# Patient Record
Sex: Female | Born: 1985
Health system: Southern US, Community
[De-identification: ages and names within clinical notes are randomized; demographics above are authoritative.]

## PROBLEM LIST (undated history)

## (undated) DIAGNOSIS — D649 Anemia, unspecified: Secondary | ICD-10-CM

## (undated) HISTORY — DX: Anemia, unspecified: D64.9

---
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
## 2015-08-07 DIAGNOSIS — G8220 Paraplegia, unspecified: Secondary | ICD-10-CM | POA: Insufficient documentation

## 2015-10-01 ENCOUNTER — Encounter (HOSPITAL_BASED_OUTPATIENT_CLINIC_OR_DEPARTMENT_OTHER): Payer: Self-pay | Admitting: Emergency Medicine

## 2015-10-01 ENCOUNTER — Emergency Department (HOSPITAL_BASED_OUTPATIENT_CLINIC_OR_DEPARTMENT_OTHER)
Admission: EM | Admit: 2015-10-01 | Discharge: 2015-10-01 | Disposition: A | Discharge status: Home / Self Care | Payer: Medicaid Other | Attending: Emergency Medicine | Admitting: Emergency Medicine

## 2015-10-01 DIAGNOSIS — Z79899 Other long term (current) drug therapy: Secondary | ICD-10-CM | POA: Diagnosis not present

## 2015-10-01 DIAGNOSIS — F17210 Nicotine dependence, cigarettes, uncomplicated: Secondary | ICD-10-CM | POA: Diagnosis not present

## 2015-10-01 DIAGNOSIS — J0100 Acute maxillary sinusitis, unspecified: Secondary | ICD-10-CM | POA: Diagnosis not present

## 2015-10-01 DIAGNOSIS — H9202 Otalgia, left ear: Secondary | ICD-10-CM | POA: Diagnosis present

## 2015-10-01 MED ORDER — OXYMETAZOLINE HCL 0.05 % NA SOLN: 1.0000 | Freq: Two times a day (BID) | NASAL | 0 refills | Status: DC

## 2015-10-01 MED ORDER — BENZONATATE 100 MG PO CAPS: 200.0000 mg | ORAL_CAPSULE | Freq: Two times a day (BID) | ORAL | 0 refills | Status: DC | PRN

## 2015-10-01 MED FILL — NASAL DECONGESTANT 0.05% SP: 0.05 | 30 days supply | Qty: 15 | Fill #0

## 2015-10-01 MED FILL — BENZONATATE 100 MG CAPSULE: 100 | 5 days supply | Qty: 20 | Fill #0

## 2015-10-01 NOTE — ED Triage Notes (Signed)
Pt c/o LT ear pain, radiating to face and throat x 3d; also reports productive cough with "brown stuff" x 2d.

## 2015-10-01 NOTE — Discharge Instructions (Signed)
Take your medications as prescribed. You may also take Tylenol or ibuprofen as prescribed over-the-counter as needed for pain relief. Continue drinking fluids at home to remain hydrated. Follow-up with your primary care provider in the next 3-4 days if your symptoms have not improved. Please return to the Emergency Department if symptoms worsen or new onset of fever, headache, neck stiffness, difficulty breathing, chest pain, abdominal pain, loss of hearing, ear drainage, facial swelling.

## 2015-10-01 NOTE — ED Provider Notes (Signed)
[facility] Provider Note   CSN: [id number] Arrival date & time: 10/01/15  [id number]     History   Chief Complaint Chief Complaint  Patient presents with  . Otalgia    HPI Dana Chen is a 30 y.o. female.  Patient is a 30 year old female with no pertinent past medical history presents to the ED with complaint of left ear pain, onset 3 days. Patient reports she has had worsening left ear pain which she notes radiates to her throat. Endorses associated sinus pressure, nasal congestion, postnasal drip, productive cough (yellow sputum), nausea. She states she has been taking over-the-counter NyQuil and Cold-Eeze without relief. Denies fever, headache, neck stiffness, visual/neck swelling, dysphasia, difficulty breathing, wheezing, chest pain, abdominal pain. She notes one of her children came home from school with cold-like symptoms earlier this week.    History reviewed. No pertinent past medical history.  There are no active problems to display for this patient.   History reviewed. No pertinent surgical history.  OB History    No data available       Home Medications    Prior to Admission medications   Medication Sig Start Date End Date Taking? Authorizing Provider  Vitamin D, Ergocalciferol, (DRISDOL) 50000 units CAPS capsule Take 50,000 Units by mouth every 7 (seven) days.   Yes Historical Provider, MD  benzonatate (TESSALON) 100 MG capsule Take 2 capsules (200 mg total) by mouth 2 (two) times daily as needed for cough. 10/01/15   Barrett HenleNicole Elizabeth Larin Depaoli, PA-C  oxymetazoline (AFRIN NASAL SPRAY) 0.05 % nasal spray Place 1 spray into both nostrils 2 (two) times daily. Do not use for more than 3 days prevent rebound rhinorrhea. 10/01/15   Barrett HenleNicole Elizabeth Naythan Douthit, PA-C    Family History History reviewed. No pertinent family history.  Social History Social History  Substance Use Topics  . Smoking status: Current Every Day Smoker    Packs/day: 1.00    Types:  Cigarettes  . Smokeless tobacco: Never Used  . Alcohol use Yes     Comment: social, occasional      Allergies   Review of patient's allergies indicates no known allergies.   Review of Systems Review of Systems  HENT: Positive for congestion, ear pain, postnasal drip, sinus pressure and sore throat.   Respiratory: Positive for cough.   Gastrointestinal: Positive for nausea.  All other systems reviewed and are negative.    Physical Exam Updated Vital Signs BP 118/90 (BP Location: Left Arm)   Pulse 84   Temp 98.1 F (36.7 C) (Oral)   Resp 18   Ht 5\' 5"  (1.651 m)   Wt 71.2 kg   LMP  (LMP Unknown)   SpO2 98%   BMI 26.13 kg/m   Physical Exam  Constitutional: She is oriented to person, place, and time. She appears well-developed and well-nourished. No distress.  HENT:  Head: Normocephalic and atraumatic.  Right Ear: A middle ear effusion is present.  Left Ear: A middle ear effusion is present.  Nose: Right sinus exhibits maxillary sinus tenderness and frontal sinus tenderness. Left sinus exhibits maxillary sinus tenderness and frontal sinus tenderness.  Mouth/Throat: Uvula is midline, oropharynx is clear and moist and mucous membranes are normal. No oropharyngeal exudate, posterior oropharyngeal edema, posterior oropharyngeal erythema or tonsillar abscesses. No tonsillar exudate.  Eyes: Conjunctivae and EOM are normal. Pupils are equal, round, and reactive to light. Right eye exhibits no discharge. Left eye exhibits no discharge. No scleral icterus.  Neck: Normal range of motion.  Neck supple.  Cardiovascular: Normal rate, regular rhythm, normal heart sounds and intact distal pulses.   Pulmonary/Chest: Effort normal and breath sounds normal. No respiratory distress. She has no wheezes. She has no rales. She exhibits no tenderness.  Abdominal: Soft. Bowel sounds are normal. She exhibits no distension and no mass. There is no tenderness. There is no rebound and no guarding.    Musculoskeletal: Normal range of motion. She exhibits no edema.  Lymphadenopathy:    She has no cervical adenopathy.  Neurological: She is alert and oriented to person, place, and time.  Skin: Skin is warm and dry. She is not diaphoretic.  Nursing note and vitals reviewed.    ED Treatments / Results  Labs (all labs ordered are listed, but only abnormal results are displayed) Labs Reviewed - No data to display  EKG  EKG Interpretation None       Radiology No results found.  Procedures Procedures (including critical care time)  Medications Ordered in ED Medications - No data to display   Initial Impression / Assessment and Plan / ED Course  I have reviewed the triage vital signs and the nursing notes.  Pertinent labs & imaging results that were available during my care of the patient were reviewed by me and considered in my medical decision making (see chart for details).  Clinical Course    Patient complaining of symptoms of sinusitis/URI.    Mild symptoms of clear/yellow nasal discharge/congestion and scratchy throat with cough for less than 10 days.  Patient is afebrile.  No concern for acute bacterial rhinosinusitis; likely viral in nature.  Patient discharged with symptomatic treatment.  Patient instructions given for warm saline nasal washes.  Recommendations for follow-up with primary care physician.     Final Clinical Impressions(s) / ED Diagnoses   Final diagnoses:  Acute maxillary sinusitis, recurrence not specified    New Prescriptions New Prescriptions   BENZONATATE (TESSALON) 100 MG CAPSULE    Take 2 capsules (200 mg total) by mouth 2 (two) times daily as needed for cough.   OXYMETAZOLINE (AFRIN NASAL SPRAY) 0.05 % NASAL SPRAY    Place 1 spray into both nostrils 2 (two) times daily. Do not use for more than 3 days prevent rebound rhinorrhea.     Satira Sark [place], [place] 10/01/15 [address] [place], [place] 10/01/15 [phone]

## 2017-10-14 ENCOUNTER — Ambulatory Visit
Admission: RE | Admit: 2017-10-14 | Discharge: 2017-10-14 | Disposition: A | Discharge status: Home / Self Care | Payer: Disability Insurance | Source: Ambulatory Visit | Attending: Pediatrics | Admitting: Pediatrics

## 2017-10-14 ENCOUNTER — Other Ambulatory Visit: Payer: Self-pay | Admitting: Pediatrics

## 2017-10-14 DIAGNOSIS — X58XXXA Exposure to other specified factors, initial encounter: Secondary | ICD-10-CM | POA: Diagnosis not present

## 2017-10-14 DIAGNOSIS — S3992XA Unspecified injury of lower back, initial encounter: Secondary | ICD-10-CM | POA: Diagnosis not present

## 2017-10-14 DIAGNOSIS — T1490XA Injury, unspecified, initial encounter: Secondary | ICD-10-CM

## 2018-01-04 NOTE — L&D Delivery Note (Signed)
Vaginal Delivery Note  Spontaneous delivery of live viable female infant from the LOA position through an intact  perineum. Delivery of anterior right shoulder with gentle downward guidance followed by delivery of the left posterior shoulder with gentle upward guidance. Body followed spontaneously. Body cord was reduced.  Infant placed on maternal chest. Nursery present and helped with neonatal resuscitation and evaluation. Cord clamped and cut after one minute. Cord blood collected. Placenta delivered spontaneously and intact with a 3 vessel cord.  2nd degree laceration. Uterus firm and below umbilicus at the end of the delivery.  Mom and baby recovering in stable condition. Sponge and needle counts were correct at the end of the delivery.  APGARS: 1 minute:8 5 minutes: 9 Weight: pending Name: Lanny Cramp MD [place], [facility] 07/11/18 10:53 PM

## 2018-01-26 ENCOUNTER — Encounter: Payer: Self-pay | Admitting: Obstetrics and Gynecology

## 2018-02-03 ENCOUNTER — Ambulatory Visit (INDEPENDENT_AMBULATORY_CARE_PROVIDER_SITE_OTHER): Payer: Medicaid Other | Admitting: Maternal Newborn

## 2018-02-03 ENCOUNTER — Other Ambulatory Visit (HOSPITAL_COMMUNITY)
Admission: RE | Admit: 2018-02-03 | Discharge: 2018-02-03 | Disposition: A | Discharge status: Home / Self Care | Payer: Medicaid Other | Source: Ambulatory Visit | Attending: Obstetrics and Gynecology | Admitting: Obstetrics and Gynecology

## 2018-02-03 ENCOUNTER — Encounter: Payer: Self-pay | Admitting: Maternal Newborn

## 2018-02-03 VITALS — BP 120/70 | Wt 187.0 lb

## 2018-02-03 DIAGNOSIS — Z3A17 17 weeks gestation of pregnancy: Secondary | ICD-10-CM

## 2018-02-03 DIAGNOSIS — Z113 Encounter for screening for infections with a predominantly sexual mode of transmission: Secondary | ICD-10-CM | POA: Insufficient documentation

## 2018-02-03 DIAGNOSIS — F1290 Cannabis use, unspecified, uncomplicated: Secondary | ICD-10-CM | POA: Insufficient documentation

## 2018-02-03 DIAGNOSIS — O99322 Drug use complicating pregnancy, second trimester: Secondary | ICD-10-CM

## 2018-02-03 DIAGNOSIS — Z3480 Encounter for supervision of other normal pregnancy, unspecified trimester: Secondary | ICD-10-CM | POA: Insufficient documentation

## 2018-02-03 DIAGNOSIS — Z3201 Encounter for pregnancy test, result positive: Secondary | ICD-10-CM

## 2018-02-03 DIAGNOSIS — O99332 Smoking (tobacco) complicating pregnancy, second trimester: Secondary | ICD-10-CM

## 2018-02-03 DIAGNOSIS — F17210 Nicotine dependence, cigarettes, uncomplicated: Secondary | ICD-10-CM

## 2018-02-03 DIAGNOSIS — O99330 Smoking (tobacco) complicating pregnancy, unspecified trimester: Secondary | ICD-10-CM

## 2018-02-03 DIAGNOSIS — Z369 Encounter for antenatal screening, unspecified: Secondary | ICD-10-CM

## 2018-02-03 LAB — POCT URINALYSIS DIPSTICK OB
Glucose, UA: NEGATIVE
PROTEIN: NEGATIVE

## 2018-02-03 LAB — POCT URINE PREGNANCY: Preg Test, Ur: POSITIVE — AB

## 2018-02-03 LAB — OB RESULTS CONSOLE VARICELLA ZOSTER ANTIBODY, IGG: Varicella: IMMUNE

## 2018-02-03 MED ORDER — NICOTINE 21 MG/24HR TD PT24: 21.0000 mg | MEDICATED_PATCH | Freq: Every day | TRANSDERMAL | 1 refills | Status: DC

## 2018-02-03 NOTE — Progress Notes (Signed)
02/03/2018   Chief Complaint: Amenorrhea, positive home pregnancy test, desires prenatal care.  Transfer of Care Patient: no  History of Present Illness: Dana Chen is a 33 y.o. G3P2002 at [redacted]w[redacted]d based on Patient's last menstrual period on 10/01/2017, with an Estimated Date of Delivery: 07/08/2018, with the above CC.   Her periods were: regular periods every month. She has Positive signs or symptoms of nausea/vomiting of pregnancy. She has Negative signs or symptoms of miscarriage or preterm labor She identifies Negative Zika risk factors for her and her partner On any different medications around the time she conceived/early pregnancy: No, currently occasional use of Zofran for nausea History of varicella: Yes    Review of Systems  Constitutional: Positive for malaise/fatigue.       Change in appetite  HENT: Negative.   Eyes: Negative.   Respiratory: Positive for cough. Negative for shortness of breath and wheezing.   Cardiovascular: Negative for chest pain and palpitations.  Gastrointestinal: Positive for constipation, nausea and vomiting. Negative for abdominal pain.  Genitourinary: Positive for frequency. Negative for dysuria.  Musculoskeletal: Positive for joint pain and myalgias.  Skin: Positive for itching.  Neurological: Positive for tingling and headaches.  Endo/Heme/Allergies: Positive for environmental allergies.       Hot flashes and heat/cold intolerance  Psychiatric/Behavioral: Negative for depression. The patient is nervous/anxious.   Breasts: positive for tenderness  ROS: Review of systems was otherwise negative, except as stated in the above HPI.  OBGYN History: As per HPI. OB History  Gravida Para Term Preterm AB Living  3 2 2  0 0 2  SAB TAB Ectopic Multiple Live Births          2    # Outcome Date GA Lbr Len/2nd Weight Sex Delivery Anes PTL Lv  3 Current           2 Term           1 Term             Any issues with any prior pregnancies: no Any prior  children are healthy, doing well, without any problems or issues: yes History of pap smears: Yes. Last pap smear 08/04/2017. Abnormal: Patient reports normal Pap, requested that she obtain records. History of STIs: Yes, remote history of chlamydia.   Past Medical History: History reviewed. No pertinent past medical history.  Past Surgical History: History reviewed. No pertinent surgical history.  Family History:  Family History  Problem Relation Age of Onset  . Brain cancer Mother    She denies any female cancers, bleeding or blood clotting disorders.  She reports an uncle with Down's syndrome and a sister with cerebral palsy. No other history of intellectual disability, birth defects or genetic disorders in her or the FOB's history  Social History:  Social History   Socioeconomic History  . Marital status: Single    Spouse name: Not on file  . Number of children: Not on file  . Years of education: Not on file  . Highest education level: Not on file  Occupational History  . Not on file  Social Needs  . Financial resource strain: Not on file  . Food insecurity:    Worry: Not on file    Inability: Not on file  . Transportation needs:    Medical: Not on file    Non-medical: Not on file  Tobacco Use  . Smoking status: Current Every Day Smoker    Packs/day: 1.00    Types: Cigarettes  .  Smokeless tobacco: Never Used  Substance and Sexual Activity  . Alcohol use: Yes    Comment: social, occasional   . Drug use: Yes    Frequency: 2.0 times per week    Types: Marijuana  . Sexual activity: Yes    Birth control/protection: None  Lifestyle  . Physical activity:    Days per week: Not on file    Minutes per session: Not on file  . Stress: Not on file  Relationships  . Social connections:    Talks on phone: Not on file    Gets together: Not on file    Attends religious service: Not on file    Active member of club or organization: Not on file    Attends meetings of clubs  or organizations: Not on file    Relationship status: Not on file  . Intimate partner violence:    Fear of current or ex partner: Not on file    Emotionally abused: Not on file    Physically abused: Not on file    Forced sexual activity: Not on file  Other Topics Concern  . Not on file  Social History Narrative  . Not on file   Any cats in the household: yes, does not handle cat feces or litter Domestic violence screening negative.  Allergy: Allergies  Allergen Reactions  . Peanut Oil Hives  . Strawberry Extract Hives    Current Outpatient Medications:  Current Outpatient Medications:  .  benzonatate (TESSALON) 100 MG capsule, Take 2 capsules (200 mg total) by mouth 2 (two) times daily as needed for cough. (Patient not taking: Reported on 02/03/2018), Disp: 20 capsule, Rfl: 0 .  nicotine (NICODERM CQ - DOSED IN MG/24 HOURS) 21 mg/24hr patch, Place 1 patch (21 mg total) onto the skin daily., Disp: 28 patch, Rfl: 1 .  oxymetazoline (AFRIN NASAL SPRAY) 0.05 % nasal spray, Place 1 spray into both nostrils 2 (two) times daily. Do not use for more than 3 days prevent rebound rhinorrhea. (Patient not taking: Reported on 02/03/2018), Disp: 30 mL, Rfl: 0 .  Vitamin D, Ergocalciferol, (DRISDOL) 50000 units CAPS capsule, Take 50,000 Units by mouth every 7 (seven) days., Disp: , Rfl:    Physical Exam:   BP 120/70   Wt 187 lb (84.8 kg)   LMP 10/01/2017    Constitutional: Well nourished, well developed female in no acute distress.  Neck:  Supple, normal appearance, and no thyromegaly  Cardiovascular: S1, S2 normal, no murmur, rub or gallop, regular rate and rhythm Respiratory:  Clear to auscultation bilaterally. Normal respiratory effort Abdomen: positive bowel sounds and no masses, hernias; diffusely non tender to palpation, non distended Breasts: breasts appear normal, no suspicious masses, no skin or nipple changes or axillary nodes. Neuro/Psych:  Normal mood and affect.  Skin:  Warm  and dry.  Lymphatic:  No inguinal lymphadenopathy.   External genitalia, Bartholin's glands, Urethra, Skene's glands: within normal limits Vagina: within normal limits and with no blood in the vault  Cervix: deferred per patient request Uterus:  enlarged, c/w 17 week size Adnexa:  no mass, fullness, tenderness  Assessment: Dana Chen is a 33 y.o. G3P2002 at [redacted]w[redacted]d based on Patient's last menstrual period was 10/01/2017, with an Estimated Date of Delivery: 07/08/2018, presenting for prenatal care.  Plan:  1) Avoid alcoholic beverages. 2) Patient encouraged not to smoke. Currently smoking 4-5 cigarettes/day. Willing to try patch to help her stop. 3) Discontinue the use of all non-medicinal drugs and chemicals. Admits marijuana  use to help with nausea. Advised cessation and complete avoidance during pregnancy. 4) Take prenatal vitamins daily.  5) Seatbelt use advised 6) Nutrition, food safety (fish, cheese advisories, and high nitrite foods) and exercise discussed. 7) Hospital and practice style delivering [facility] discussed  8) Patient is asked about travel to areas at risk for the Zika virus, and counseled to avoid travel and exposure to mosquitoes or people who may have themselves been exposed to the virus.   9) [facility] advised 10) Genetic Screening, such as with 1st Trimester Screening, cell free fetal DNA, AFP testing, and Ultrasound, is discussed with patient. She plans to have genetic testing this pregnancy. 11) FHT heard today in 150s.  Problem list reviewed and updated.  Return in about 1 week (around 02/10/2018) for ROB and ultrasound.  Marcelyn BruinsJacelyn Valkyrie Guardiola, CNM [facility], [facility] 02/03/2018

## 2018-02-05 LAB — URINE CULTURE

## 2018-02-06 ENCOUNTER — Encounter: Payer: Self-pay | Admitting: Maternal Newborn

## 2018-02-07 LAB — RPR+RH+ABO+RUB AB+AB SCR+CB...
ANTIBODY SCREEN: NEGATIVE
HEMATOCRIT: 30 % — AB (ref 34.0–46.6)
HIV SCREEN 4TH GENERATION: NONREACTIVE
Hemoglobin: 10.2 g/dL — ABNORMAL LOW (ref 11.1–15.9)
Hepatitis B Surface Ag: NEGATIVE
MCH: 30.3 pg (ref 26.6–33.0)
MCHC: 34 g/dL (ref 31.5–35.7)
MCV: 89 fL (ref 79–97)
PLATELETS: 294 10*3/uL (ref 150–450)
RBC: 3.37 x10E6/uL — AB (ref 3.77–5.28)
RDW: 13.1 % (ref 11.7–15.4)
RPR: NONREACTIVE
Rh Factor: POSITIVE
Rubella Antibodies, IGG: 2.5 index (ref >0.99)
Varicella zoster IgG: 954 index (ref >165)
WBC: 7.3 10*3/uL (ref 3.4–10.8)

## 2018-02-07 LAB — HEMOGLOBINOPATHY EVALUATION
HEMOGLOBIN F QUANTITATION: 0 % (ref 0.0–2.0)
HGB C: 39.1 % — AB
HGB S: 0 %
HGB VARIANT: 0 %
Hemoglobin A2 Quantitation: 3.1 % (ref 1.8–3.2)
Hgb A: 57.8 % — ABNORMAL LOW (ref 96.4–98.8)

## 2018-02-07 LAB — URINE DRUG PANEL 7
AMPHETAMINES, URINE: NEGATIVE ng/mL
BARBITURATE QUANT UR: NEGATIVE ng/mL
BENZODIAZEPINE QUANT UR: NEGATIVE ng/mL
Cannabinoid Quant, Ur: POSITIVE — AB
Cocaine (Metab.): NEGATIVE ng/mL
OPIATE QUANT UR: NEGATIVE ng/mL
PCP Quant, Ur: NEGATIVE ng/mL

## 2018-02-07 LAB — CERVICOVAGINAL ANCILLARY ONLY
Chlamydia: NEGATIVE
NEISSERIA GONORRHEA: NEGATIVE

## 2018-02-10 ENCOUNTER — Other Ambulatory Visit: Payer: Self-pay | Admitting: Maternal Newborn

## 2018-02-10 DIAGNOSIS — Z3480 Encounter for supervision of other normal pregnancy, unspecified trimester: Secondary | ICD-10-CM

## 2018-02-10 DIAGNOSIS — O99019 Anemia complicating pregnancy, unspecified trimester: Secondary | ICD-10-CM

## 2018-02-10 MED ORDER — FUSION PLUS PO CAPS: 1.0000 | ORAL_CAPSULE | Freq: Every day | ORAL | 6 refills | Status: DC

## 2018-02-10 NOTE — Progress Notes (Signed)
Rx sent for Fusion Plus iron supplement.

## 2018-02-13 ENCOUNTER — Ambulatory Visit (INDEPENDENT_AMBULATORY_CARE_PROVIDER_SITE_OTHER): Payer: Medicaid Other

## 2018-02-13 ENCOUNTER — Encounter: Payer: Self-pay | Admitting: Certified Nurse Midwife

## 2018-02-13 ENCOUNTER — Ambulatory Visit (INDEPENDENT_AMBULATORY_CARE_PROVIDER_SITE_OTHER): Payer: Medicaid Other | Admitting: Certified Nurse Midwife

## 2018-02-13 VITALS — BP 110/68 | Wt 192.0 lb

## 2018-02-13 DIAGNOSIS — Z3482 Encounter for supervision of other normal pregnancy, second trimester: Secondary | ICD-10-CM

## 2018-02-13 DIAGNOSIS — Z369 Encounter for antenatal screening, unspecified: Secondary | ICD-10-CM

## 2018-02-13 DIAGNOSIS — Z3A19 19 weeks gestation of pregnancy: Secondary | ICD-10-CM

## 2018-02-13 DIAGNOSIS — Z363 Encounter for antenatal screening for malformations: Secondary | ICD-10-CM | POA: Diagnosis not present

## 2018-02-13 DIAGNOSIS — Z3480 Encounter for supervision of other normal pregnancy, unspecified trimester: Secondary | ICD-10-CM

## 2018-02-13 LAB — POCT URINALYSIS DIPSTICK OB
Glucose, UA: NEGATIVE
POC,PROTEIN,UA: NEGATIVE

## 2018-02-13 NOTE — Progress Notes (Signed)
ROB Anatomy Scan/ It's a Girl!

## 2018-02-13 NOTE — Progress Notes (Signed)
ROB/ anatomy scan at 19wk2d: CGA 19wk2d, cephalic, incomplete anatomy for cervical length and fetal spine. Placenta anterior and 2.4cm from cx. Female gender NOB labs reviewed: Hemoglobin C trait. States that FOB does not have sickle cell trait.. Advised to ask FOB whether he had ever had test to check for  Hemoglobinopathy Mild anemia with H&H 10.2 gm/dl and 84%.  +FM PLan: Follow up anatomy scan and ROB in 4 weeks  Farrel Conners, CNM  Addendum: called patient regarding desires for MaterniT 21. She will schedule appointment to have labs drawn in the next week. Farrel Conners, CNM

## 2018-02-17 ENCOUNTER — Telehealth: Payer: Self-pay

## 2018-02-17 NOTE — Telephone Encounter (Signed)
CLG wants to know if patient wants genetic testing done since it says she desires it. Called pt, no answer, LVMTRC.

## 2018-02-17 NOTE — Telephone Encounter (Signed)
Tried again before leaving and no luck. No voice msg left this time.

## 2018-02-21 ENCOUNTER — Telehealth: Payer: Self-pay

## 2018-02-21 NOTE — Telephone Encounter (Signed)
Pt states she does want the Genetic testing, Does she need to come in for labs? Or wait for next apt. She also wanted to let CLG know she is having a lot of pressure if she stands or sits too long, should she been seen for this, please advise

## 2018-02-21 NOTE — Telephone Encounter (Signed)
Pt called triage, left message for CLG to return call. I called pt to get more info, no answer, left message for her to call [facility] back.

## 2018-02-21 NOTE — Telephone Encounter (Signed)
Dana Chen is going to schedule pt as soon as possible to see provider and have labs done per CLG

## 2018-02-21 NOTE — Telephone Encounter (Signed)
Please add her on to my schedule this week. Thanks

## 2018-02-21 NOTE — Telephone Encounter (Signed)
Please advise. Thank you

## 2018-02-21 NOTE — Telephone Encounter (Signed)
Patient is schedule 02/22/18 with CLG °

## 2018-02-22 ENCOUNTER — Ambulatory Visit (INDEPENDENT_AMBULATORY_CARE_PROVIDER_SITE_OTHER): Payer: Medicaid Other | Admitting: Certified Nurse Midwife

## 2018-02-22 VITALS — BP 122/70 | Wt 189.0 lb

## 2018-02-22 DIAGNOSIS — Z3A20 20 weeks gestation of pregnancy: Secondary | ICD-10-CM

## 2018-02-22 DIAGNOSIS — O26892 Other specified pregnancy related conditions, second trimester: Secondary | ICD-10-CM

## 2018-02-22 DIAGNOSIS — N949 Unspecified condition associated with female genital organs and menstrual cycle: Secondary | ICD-10-CM

## 2018-02-22 DIAGNOSIS — R102 Pelvic and perineal pain: Secondary | ICD-10-CM

## 2018-02-22 DIAGNOSIS — Z3480 Encounter for supervision of other normal pregnancy, unspecified trimester: Secondary | ICD-10-CM

## 2018-02-22 DIAGNOSIS — Z1379 Encounter for other screening for genetic and chromosomal anomalies: Secondary | ICD-10-CM

## 2018-02-22 NOTE — Progress Notes (Signed)
OB problem visit  C/o pelvic pressure, cant sit or stand for to long

## 2018-02-26 NOTE — Progress Notes (Signed)
33 year old G3 P2002 at [facility] presented for MaterniT 21 testing with complaints of pelvic pressure. 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 Starts feeling a lot of pelvic pressure and soreness after 6 hours on her feet. No dysuria. NO vaginal bleeding Cervix: closed/ thick/ OOP Some mild descensus of the uterus with Valsalva  MaterniT 21 drawn. Letter for work recommending sitting after 4-6 hours on feet Recommend maternity support garment/ belt FU as scheduled. Will call with lab results.  Farrel Conners, CNM

## 2018-02-27 LAB — MATERNIT21 PLUS CORE+SCA
CHROMOSOME 21: NEGATIVE
Chromosome 13: NEGATIVE
Chromosome 18: NEGATIVE
Fetal Fraction: 13
Y Chromosome: NOT DETECTED

## 2018-03-10 ENCOUNTER — Other Ambulatory Visit: Payer: Self-pay | Admitting: Obstetrics & Gynecology

## 2018-03-10 DIAGNOSIS — Z3482 Encounter for supervision of other normal pregnancy, second trimester: Secondary | ICD-10-CM

## 2018-03-13 ENCOUNTER — Other Ambulatory Visit: Payer: Medicaid Other

## 2018-03-13 ENCOUNTER — Encounter: Payer: Medicaid Other | Admitting: Obstetrics & Gynecology

## 2018-03-15 ENCOUNTER — Other Ambulatory Visit: Payer: Self-pay

## 2018-03-15 ENCOUNTER — Ambulatory Visit (INDEPENDENT_AMBULATORY_CARE_PROVIDER_SITE_OTHER): Payer: Medicaid Other | Admitting: Advanced Practice Midwife

## 2018-03-15 ENCOUNTER — Ambulatory Visit (INDEPENDENT_AMBULATORY_CARE_PROVIDER_SITE_OTHER): Payer: Medicaid Other

## 2018-03-15 ENCOUNTER — Encounter: Payer: Self-pay | Admitting: Advanced Practice Midwife

## 2018-03-15 VITALS — BP 100/60 | Wt 194.0 lb

## 2018-03-15 DIAGNOSIS — Z113 Encounter for screening for infections with a predominantly sexual mode of transmission: Secondary | ICD-10-CM

## 2018-03-15 DIAGNOSIS — Z3482 Encounter for supervision of other normal pregnancy, second trimester: Secondary | ICD-10-CM

## 2018-03-15 DIAGNOSIS — Z362 Encounter for other antenatal screening follow-up: Secondary | ICD-10-CM

## 2018-03-15 DIAGNOSIS — Z3A23 23 weeks gestation of pregnancy: Secondary | ICD-10-CM

## 2018-03-15 DIAGNOSIS — Z131 Encounter for screening for diabetes mellitus: Secondary | ICD-10-CM

## 2018-03-15 DIAGNOSIS — Z130 Encounter for screening for diseases of the blood and blood-forming organs and certain disorders involving the immune mechanism: Secondary | ICD-10-CM

## 2018-03-15 DIAGNOSIS — Z3480 Encounter for supervision of other normal pregnancy, unspecified trimester: Secondary | ICD-10-CM

## 2018-03-15 NOTE — Progress Notes (Signed)
  Routine Prenatal Care Visit  Subjective  Dana Chen is a 33 y.o. G3P2002 at [redacted]w[redacted]d being seen today for ongoing prenatal care.  She is currently monitored for the following issues for this low-risk pregnancy and has Supervision of other normal pregnancy, antepartum; Tobacco smoking affecting pregnancy, antepartum; and Marijuana use on their problem list.  ----------------------------------------------------------------------------------- Patient reports no complaints.  Reviewed safe OTC meds for seasonal allergies.  Contractions: Not present. Vag. Bleeding: None.  Movement: Present. Denies leaking of fluid.  ----------------------------------------------------------------------------------- The following portions of the patient's history were reviewed and updated as appropriate: allergies, current medications, past family history, past medical history, past social history, past surgical history and problem list. Problem list updated.   Objective  Blood pressure 100/60, weight 194 lb (88 kg), last menstrual period 10/01/2017. Pregravid weight 163 lb (73.9 kg) Total Weight Gain 31 lb (14.1 kg) Urinalysis: Urine Protein    Urine Glucose    Fetal Status: Fetal Heart Rate (bpm): 144 Fundal Height: 24 cm Movement: Present     Follow up anatomy today: complete for spine, cervical length 4.0 cm, no previa, anterior placenta, breech, AFI normal  General:  Alert, oriented and cooperative. Patient is in no acute distress.  Skin: Skin is warm and dry. No rash noted.   Cardiovascular: Normal heart rate noted  Respiratory: Normal respiratory effort, no problems with respiration noted  Abdomen: Soft, gravid, appropriate for gestational age. Pain/Pressure: Present     Pelvic:  Cervical exam deferred        Extremities: Normal range of motion.  Edema: None  Mental Status: Normal mood and affect. Normal behavior. Normal judgment and thought content.   Assessment   33 y.o. [id number] at [redacted]w[redacted]d by   07/08/2018, by Last Menstrual Period presenting for routine prenatal visit  Plan   pregnancy3 Problems (from 10/01/17 to present)    Problem Noted Resolved   Supervision of other normal pregnancy, antepartum 02/03/2018 by Oswaldo Conroy, CNM No   Overview Signed 02/03/2018  1:38 PM by Oswaldo Conroy, CNM    Clinic [facility] Prenatal Labs  Dating  Blood type:     Genetic Screen 1 Screen:    AFP:     Quad:     NIPS: Antibody:   Anatomic [place]  Rubella:   Varicella:    GTT Early:               Third trimester:  RPR:     Rhogam  HBsAg:     TDaP vaccine  Flu Shot: HIV:     Baby Food                                GBS:   Contraception  Pap:  CBB     CS/VBAC    Support Person                  Preterm labor symptoms and general obstetric precautions including but not limited to vaginal bleeding, contractions, leaking of fluid and fetal movement were reviewed in detail with the patient.    Return in about 4 weeks (around 04/12/2018) for 28 wk labs and rob.  Tresea Mall, CNM 03/15/2018 4:00 PM

## 2018-04-12 ENCOUNTER — Ambulatory Visit (INDEPENDENT_AMBULATORY_CARE_PROVIDER_SITE_OTHER): Payer: Medicaid Other | Admitting: Obstetrics and Gynecology

## 2018-04-12 ENCOUNTER — Other Ambulatory Visit: Payer: Medicaid Other

## 2018-04-12 ENCOUNTER — Other Ambulatory Visit: Payer: Self-pay

## 2018-04-12 VITALS — BP 126/58 | Wt 194.0 lb

## 2018-04-12 DIAGNOSIS — Z3A27 27 weeks gestation of pregnancy: Secondary | ICD-10-CM

## 2018-04-12 DIAGNOSIS — D582 Other hemoglobinopathies: Secondary | ICD-10-CM | POA: Insufficient documentation

## 2018-04-12 DIAGNOSIS — Z23 Encounter for immunization: Secondary | ICD-10-CM | POA: Diagnosis not present

## 2018-04-12 DIAGNOSIS — Z131 Encounter for screening for diabetes mellitus: Secondary | ICD-10-CM

## 2018-04-12 DIAGNOSIS — Z3480 Encounter for supervision of other normal pregnancy, unspecified trimester: Secondary | ICD-10-CM

## 2018-04-12 DIAGNOSIS — Z130 Encounter for screening for diseases of the blood and blood-forming organs and certain disorders involving the immune mechanism: Secondary | ICD-10-CM

## 2018-04-12 DIAGNOSIS — Z113 Encounter for screening for infections with a predominantly sexual mode of transmission: Secondary | ICD-10-CM

## 2018-04-12 DIAGNOSIS — Z3482 Encounter for supervision of other normal pregnancy, second trimester: Secondary | ICD-10-CM

## 2018-04-12 LAB — POCT URINALYSIS DIPSTICK OB
Glucose, UA: NEGATIVE
POC,PROTEIN,UA: NEGATIVE

## 2018-04-12 NOTE — Progress Notes (Signed)
ROB 28 week labs TDAP

## 2018-04-12 NOTE — Progress Notes (Signed)
    Routine Prenatal Care Visit  Subjective  Dana Chen is a 33 y.o. G3P2002 at [redacted]w[redacted]d being seen today for ongoing prenatal care.  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  ----------------------------------------------------------------------------------- Patient reports no complaints.   Contractions: Not present. Vag. Bleeding: None.  Movement: Present. Denies leaking of fluid.  ----------------------------------------------------------------------------------- The following portions of the patient's history were reviewed and updated as appropriate: allergies, current medications, past family history, past medical history, past social history, past surgical history and problem list. Problem list updated.   Objective  Last menstrual period 10/01/2017. Pregravid weight 163 lb (73.9 kg) Total Weight Gain 31 lb (14.1 kg) Urinalysis:      Fetal Status: Fetal Heart Rate (bpm): 135 Fundal Height: 36 cm Movement: Present     General:  Alert, oriented and cooperative. Patient is in no acute distress.  Skin: Skin is warm and dry. No rash noted.   Cardiovascular: Normal heart rate noted  Respiratory: Normal respiratory effort, no problems with respiration noted  Abdomen: Soft, gravid, appropriate for gestational age. Pain/Pressure: Present     Pelvic:  Cervical exam deferred        Extremities: Normal range of motion.     ental Status: Normal mood and affect. Normal behavior. Normal judgment and thought content.   Immunization History  Administered Date(s) Administered  . Tdap 04/12/2018     Assessment   32 y.o. [id number] at [redacted]w[redacted]d by  07/08/2018, by Last Menstrual Period presenting for routine prenatal visit  Plan   pregnancy3 Problems (from 10/01/17 to present)    Problem  Noted Resolved   Hemoglobin C trait (HCC) 04/12/2018 by Vena Austria, MD No   Supervision of other normal pregnancy, antepartum 02/03/2018 by Oswaldo Conroy, CNM No   Overview Addendum 04/12/2018  9:43 AM by Vena Austria, MD    Clinic [facility] Prenatal Labs  Dating LMP = 19 week [place] Blood type: O/Positive/-- (01/31 1445)   Genetic Screen NIPS: Normal XX Antibody:Negative (01/31 1445)  Anatomic [place] Normal Rubella: 2.50 (01/31 1445) Varicella: Immune  GTT  RPR: Non Reactive (01/31 1445)   Rhogam N/A HBsAg: Negative (01/31 1445)   TDaP vaccine 04/12/2018  Flu Shot: HIV: Non Reactive (01/31 1445)   Baby Food                                GBS:   Contraception  Pap:  CBB     CS/VBAC N/A   Support Person                 Gestational age appropriate obstetric precautions including but not limited to vaginal bleeding, contractions, leaking of fluid and fetal movement were reviewed in detail with the patient.    Return in about 4 weeks (around 05/10/2018) for ROB.  Vena Austria, MD, Evern Core [facility], [facility] 04/12/2018, 9:51 AM

## 2018-04-13 ENCOUNTER — Other Ambulatory Visit: Payer: Self-pay | Admitting: Advanced Practice Midwife

## 2018-04-13 DIAGNOSIS — R7309 Other abnormal glucose: Secondary | ICD-10-CM

## 2018-04-13 DIAGNOSIS — O99019 Anemia complicating pregnancy, unspecified trimester: Secondary | ICD-10-CM

## 2018-04-13 DIAGNOSIS — Z131 Encounter for screening for diabetes mellitus: Secondary | ICD-10-CM

## 2018-04-13 LAB — 28 WEEK RH+PANEL
Basophils Absolute: 0 10*3/uL (ref 0.0–0.2)
Basos: 0 %
EOS (ABSOLUTE): 0.1 10*3/uL (ref 0.0–0.4)
Eos: 1 %
Gestational Diabetes Screen: 150 mg/dL — ABNORMAL HIGH (ref 65–139)
HIV Screen 4th Generation wRfx: NONREACTIVE
Hematocrit: 25 % — ABNORMAL LOW (ref 34.0–46.6)
Hemoglobin: 8.5 g/dL — ABNORMAL LOW (ref 11.1–15.9)
Immature Grans (Abs): 0.1 10*3/uL (ref 0.0–0.1)
Immature Granulocytes: 1 %
Lymphocytes Absolute: 2.2 10*3/uL (ref 0.7–3.1)
Lymphs: 23 %
MCH: 29.8 pg (ref 26.6–33.0)
MCHC: 34 g/dL (ref 31.5–35.7)
MCV: 88 fL (ref 79–97)
Monocytes Absolute: 0.7 10*3/uL (ref 0.1–0.9)
Monocytes: 7 %
Neutrophils Absolute: 6.7 10*3/uL (ref 1.4–7.0)
Neutrophils: 68 %
Platelets: 260 10*3/uL (ref 150–450)
RBC: 2.85 x10E6/uL — ABNORMAL LOW (ref 3.77–5.28)
RDW: 12.9 % (ref 11.7–15.4)
RPR Ser Ql: NONREACTIVE
WBC: 9.9 10*3/uL (ref 3.4–10.8)

## 2018-04-13 NOTE — Progress Notes (Signed)
Referral sent to hematology Order placed for 3 hr gtt due to elevated 1 hr

## 2018-04-21 ENCOUNTER — Other Ambulatory Visit: Payer: Self-pay

## 2018-04-21 ENCOUNTER — Other Ambulatory Visit: Payer: Medicaid Other

## 2018-04-21 DIAGNOSIS — R7309 Other abnormal glucose: Secondary | ICD-10-CM

## 2018-04-21 DIAGNOSIS — Z131 Encounter for screening for diabetes mellitus: Secondary | ICD-10-CM

## 2018-04-22 LAB — GESTATIONAL GLUCOSE TOLERANCE
Glucose, Fasting: 94 mg/dL (ref 65–94)
Glucose, GTT - 1 Hour: 123 mg/dL (ref 65–179)
Glucose, GTT - 2 Hour: 109 mg/dL (ref 65–154)
Glucose, GTT - 3 Hour: 95 mg/dL (ref 65–139)

## 2018-04-24 ENCOUNTER — Other Ambulatory Visit: Payer: Self-pay | Admitting: Advanced Practice Midwife

## 2018-04-24 DIAGNOSIS — O99019 Anemia complicating pregnancy, unspecified trimester: Principal | ICD-10-CM

## 2018-04-24 DIAGNOSIS — D509 Iron deficiency anemia, unspecified: Secondary | ICD-10-CM

## 2018-04-24 DIAGNOSIS — R7309 Other abnormal glucose: Secondary | ICD-10-CM

## 2018-04-24 DIAGNOSIS — Z131 Encounter for screening for diabetes mellitus: Secondary | ICD-10-CM

## 2018-04-24 MED ORDER — FERROUS SULFATE 325 (65 FE) MG PO TABS: 325.0000 mg | ORAL_TABLET | Freq: Every day | ORAL | 2 refills | Status: AC

## 2018-04-24 NOTE — Progress Notes (Addendum)
Reviewed normal 3 hr gtt result with patient

## 2018-04-24 NOTE — Addendum Note (Signed)
Addended by: Tresea Mall on: 04/24/2018 04:48 PM   Modules accepted: Orders

## 2018-04-24 NOTE — Progress Notes (Signed)
Patient has not yet heard from hematology. Rx sent for Ferrous Sulfate.

## 2018-05-10 ENCOUNTER — Encounter: Payer: Medicaid Other | Admitting: Obstetrics and Gynecology

## 2018-05-11 ENCOUNTER — Encounter: Payer: Self-pay | Admitting: Advanced Practice Midwife

## 2018-05-11 ENCOUNTER — Other Ambulatory Visit: Payer: Self-pay

## 2018-05-11 ENCOUNTER — Ambulatory Visit (INDEPENDENT_AMBULATORY_CARE_PROVIDER_SITE_OTHER): Payer: Medicaid Other | Admitting: Advanced Practice Midwife

## 2018-05-11 DIAGNOSIS — Z3A31 31 weeks gestation of pregnancy: Secondary | ICD-10-CM | POA: Diagnosis not present

## 2018-05-11 DIAGNOSIS — O99333 Smoking (tobacco) complicating pregnancy, third trimester: Secondary | ICD-10-CM

## 2018-05-11 NOTE — Progress Notes (Signed)
Routine Prenatal Care Visit- Virtual Visit  Subjective   Virtual Visit via Telephone Note  I connected with@ on 05/11/18 at 10:10 AM EDT by telephone and verified that I am speaking with the correct person using two identifiers.   I discussed the limitations, risks, security and privacy concerns of performing an evaluation and management service by telephone and the availability of in person appointments. I also discussed with the patient that there may be a patient responsible charge related to this service. The patient expressed understanding and agreed to proceed.  The patient was at her home I spoke with the patient from my  Office phone The names of people involved in this encounter were: the patient Dana Chen and myself Tresea MallJane Rhet Rorke, CNM   Dana Chen is a 33 y.o. G3P2002 at 10779w5d being seen today for ongoing prenatal care.  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  ----------------------------------------------------------------------------------- Patient reports feeling lightheaded 2 times recently when she was overheated and sweating. She denies passing out or falling.   Contractions: Not present. Vag. Bleeding: None.  Movement: Present. Denies leaking of fluid.  ----------------------------------------------------------------------------------- The following portions of the patient's history were reviewed and updated as appropriate: allergies, current medications, past family history, past medical history, past social history, past surgical history and problem list. Problem list updated.   Objective  Last menstrual period 10/01/2017. Pregravid weight 163 lb (73.9 kg) Total Weight Gain 31 lb (14.1 kg) Urinalysis:      Fetal Status:     Movement: Present     Physical Exam could not be performed. Because of the COVID-19 outbreak this visit was performed over the phone and not in person.   Assessment   33 y.o. [id number] at 2579w5d by  07/08/2018, by Last Menstrual Period presenting for routine prenatal visit  Plan   pregnancy3 Problems (from 10/01/17 to present)    Problem Noted Resolved   Hemoglobin C trait (HCC) 04/12/2018 by Vena AustriaStaebler, Andreas, MD No   Supervision of other normal pregnancy, antepartum 02/03/2018 by Oswaldo ConroySchmid, Jacelyn Y, CNM No   Overview Addendum 04/12/2018  9:43 AM by Vena AustriaStaebler, Andreas, MD    [facility] Prenatal Labs  Dating LMP = 19 week US Blood type: O/Positive/-- (01/31 1445)   Genetic Screen NIPS: Normal XX Antibody:Negative (01/31 1445)  Anatomic US Normal Rubella: 2.50 (01/31 1445) Varicella: Immune  GTT  RPR: Non Reactive (01/31 1445)   Rhogam N/A HBsAg: Negative (01/31 1445)   TDaP vaccine 04/12/2018  Flu Shot: HIV: Non Reactive (01/31 1445)   Baby Food                                GBS:   Contraception  Pap:  CBB     CS/VBAC N/A   Support Person                  Gestational age appropriate obstetric precautions including but not limited to vaginal bleeding, contractions, leaking of fluid and fetal movement were reviewed in detail with the patient.     Follow Up Instructions: Increase hydration Include source of protein with each meal and snack   I discussed the assessment and treatment plan with the patient. The patient was provided an opportunity to ask questions and all  were answered. The patient agreed with the plan and demonstrated an understanding of the instructions.   The patient was advised to call back or seek an in-person evaluation if the symptoms worsen or if the condition fails to improve as anticipated.  I provided 15 minutes of non-face-to-face time during this encounter.  Return in about 2 weeks (around 05/25/2018) for rob in office.  Parke Poisson, CNM [facility] 05/11/2018, 1:25 PM

## 2018-05-11 NOTE — Progress Notes (Signed)
ROB- feels overheated at times and feels like passing out, has happened twice

## 2018-05-25 ENCOUNTER — Ambulatory Visit (INDEPENDENT_AMBULATORY_CARE_PROVIDER_SITE_OTHER): Payer: Medicaid Other | Admitting: Maternal Newborn

## 2018-05-25 ENCOUNTER — Other Ambulatory Visit: Payer: Self-pay

## 2018-05-25 ENCOUNTER — Encounter: Payer: Self-pay | Admitting: Maternal Newborn

## 2018-05-25 VITALS — BP 110/60 | Wt 200.0 lb

## 2018-05-25 DIAGNOSIS — Z3483 Encounter for supervision of other normal pregnancy, third trimester: Secondary | ICD-10-CM

## 2018-05-25 DIAGNOSIS — Z3A33 33 weeks gestation of pregnancy: Secondary | ICD-10-CM

## 2018-05-25 DIAGNOSIS — Z3480 Encounter for supervision of other normal pregnancy, unspecified trimester: Secondary | ICD-10-CM

## 2018-05-25 LAB — POCT URINALYSIS DIPSTICK OB
Glucose, UA: NEGATIVE
POC,PROTEIN,UA: NEGATIVE

## 2018-05-25 NOTE — Progress Notes (Signed)
    Routine Prenatal Care Visit  Subjective  Dana Chen is a 33 y.o. G3P2002 at [redacted]w[redacted]d being seen today for ongoing prenatal care.  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  ----------------------------------------------------------------------------------- Patient reports heartburn, occasional Braxton-Hicks contractions every few days. Contractions: Not present. Vag. Bleeding: None.  Movement: Present. No leaking of fluid.  ----------------------------------------------------------------------------------- The following portions of the patient's history were reviewed and updated as appropriate: allergies, current medications, past family history, past medical history, past social history, past surgical history and problem list. Problem list updated.  Objective  Blood pressure 110/60, weight 200 lb (90.7 kg), last menstrual period 10/01/2017. Pregravid weight 163 lb (73.9 kg) Total Weight Gain 37 lb (16.8 kg) Urinalysis: Urine dipstick shows negative for glucose, protein. Fetal Status: Fetal Heart Rate (bpm): 142 Fundal Height: 35 cm Movement: Present     General:  Alert, oriented and cooperative. Patient is in no acute distress.  Skin: Skin is warm and dry. No rash noted.   Cardiovascular: Normal heart rate noted  Respiratory: Normal respiratory effort, no problems with respiration noted  Abdomen: Soft, gravid, appropriate for gestational age. Pain/Pressure: Present     Pelvic:  Cervical exam deferred        Extremities: Normal range of motion.     Mental Status: Normal mood and affect. Normal behavior. Normal judgment and thought content.    Assessment   33 y.o. [id number] at [redacted]w[redacted]d, EDD 07/08/2018 by Last Menstrual Period presenting for a routine prenatal visit.  Plan    pregnancy3 Problems (from 10/01/17 to present)    Problem Noted Resolved   Hemoglobin C trait (HCC) 04/12/2018 by Vena Austria, MD No   Supervision of other normal pregnancy, antepartum 02/03/2018 by Oswaldo Conroy, CNM No   Overview Addendum 04/12/2018  9:43 AM by Vena Austria, MD    [facility] Prenatal Labs  Dating LMP = 19 week [place] Blood type: O/Positive/-- (01/31 1445)   Genetic Screen NIPS: Normal XX Antibody:Negative (01/31 1445)  Anatomic [place] Normal Rubella: 2.50 (01/31 1445) Varicella: Immune  GTT  RPR: Non Reactive (01/31 1445)   Rhogam N/A HBsAg: Negative (01/31 1445)   TDaP vaccine 04/12/2018  Flu Shot: HIV: Non Reactive (01/31 1445)   Baby Food                                GBS:   Contraception  Pap:  CBB     CS/VBAC N/A   Support Person               Discussed medication for heartburn. She prefers not to take Pepcid, gets relief with Tums and avoids trigger foods.   Preterm labor symptoms and general obstetric precautions including but not limited to vaginal bleeding, contractions, leaking of fluid and fetal movement were reviewed.  Please refer to After Visit Summary for other counseling recommendations.   Return in about 2 weeks (around 06/08/2018) for ROB in person.  Marcelyn Bruins, CNM 05/25/2018  9:05 AM

## 2018-05-25 NOTE — Patient Instructions (Signed)
Third Trimester of Pregnancy The third trimester is from week 28 through week 40 (months 7 through 9). The third trimester is a time when the unborn baby (fetus) is growing rapidly. At the end of the ninth month, the fetus is about 20 inches in length and weighs 6-10 pounds. Body changes during your third trimester Your body will continue to go through many changes during pregnancy. The changes vary from woman to woman. During the third trimester:  Your weight will continue to increase. You can expect to gain 25-35 pounds (11-16 kg) by the end of the pregnancy.  You may begin to get stretch marks on your hips, abdomen, and breasts.  You may urinate more often because the fetus is moving lower into your pelvis and pressing on your bladder.  You may develop or continue to have heartburn. This is caused by increased hormones that slow down muscles in the digestive tract.  You may develop or continue to have constipation because increased hormones slow digestion and cause the muscles that push waste through your intestines to relax.  You may develop hemorrhoids. These are swollen veins (varicose veins) in the rectum that can itch or be painful.  You may develop swollen, bulging veins (varicose veins) in your legs.  You may have increased body aches in the pelvis, back, or thighs. This is due to weight gain and increased hormones that are relaxing your joints.  You may have changes in your hair. These can include thickening of your hair, rapid growth, and changes in texture. Some women also have hair loss during or after pregnancy, or hair that feels dry or thin. Your hair will most likely return to normal after your baby is born.  Your breasts will continue to grow and they will continue to become tender. A yellow fluid (colostrum) may leak from your breasts. This is the first milk you are producing for your baby.  Your belly button may stick out.  You may notice more swelling in your hands,  face, or ankles.  You may have increased tingling or numbness in your hands, arms, and legs. The skin on your belly may also feel numb.  You may feel short of breath because of your expanding uterus.  You may have more problems sleeping. This can be caused by the size of your belly, increased need to urinate, and an increase in your body's metabolism.  You may notice the fetus "dropping," or moving lower in your abdomen (lightening).  You may have increased vaginal discharge.  You may notice your joints feel loose and you may have pain around your pelvic bone. What to expect at prenatal visits You will have prenatal exams every 2 weeks until week 36. Then you will have weekly prenatal exams. During a routine prenatal visit:  You will be weighed to make sure you and the baby are growing normally.  Your blood pressure will be taken.  Your abdomen will be measured to track your baby's growth.  The fetal heartbeat will be listened to.  Any test results from the previous visit will be discussed.  You may have a cervical check near your due date to see if your cervix has softened or thinned (effaced).  You will be tested for Group B streptococcus. This happens between 35 and 37 weeks. Your health care provider may ask you:  What your birth plan is.  How you are feeling.  If you are feeling the baby move.  If you have had any abnormal   symptoms, such as leaking fluid, bleeding, severe headaches, or abdominal cramping.  If you are using any tobacco products, including cigarettes, chewing tobacco, and electronic cigarettes.  If you have any questions. Other tests or screenings that may be performed during your third trimester include:  Blood tests that check for low iron levels (anemia).  Fetal testing to check the health, activity level, and growth of the fetus. Testing is done if you have certain medical conditions or if there are problems during the pregnancy.  Nonstress test  (NST). This test checks the health of your baby to make sure there are no signs of problems, such as the baby not getting enough oxygen. During this test, a belt is placed around your belly. The baby is made to move, and its heart rate is monitored during movement. What is false labor? False labor is a condition in which you feel small, irregular tightenings of the muscles in the womb (contractions) that usually go away with rest, changing position, or drinking water. These are called Braxton Hicks contractions. Contractions may last for hours, days, or even weeks before true labor sets in. If contractions come at regular intervals, become more frequent, increase in intensity, or become painful, you should see your health care provider. What are the signs of labor?  Abdominal cramps.  Regular contractions that start at 10 minutes apart and become stronger and more frequent with time.  Contractions that start on the top of the uterus and spread down to the lower abdomen and back.  Increased pelvic pressure and dull back pain.  A watery or bloody mucus discharge that comes from the vagina.  Leaking of amniotic fluid. This is also known as your "water breaking." It could be a slow trickle or a gush. Let your health care provider know if it has a color or strange odor. If you have any of these signs, call your health care provider right away, even if it is before your due date. Follow these instructions at home: Medicines  Follow your health care provider's instructions regarding medicine use. Specific medicines may be either safe or unsafe to take during pregnancy.  Take a prenatal vitamin that contains at least 600 micrograms (mcg) of folic acid.  If you develop constipation, try taking a stool softener if your health care provider approves. Eating and drinking   Eat a balanced diet that includes fresh fruits and vegetables, whole grains, good sources of protein such as meat, eggs, or tofu,  and low-fat dairy. Your health care provider will help you determine the amount of weight gain that is right for you.  Avoid raw meat and uncooked cheese. These carry germs that can cause birth defects in the baby.  If you have low calcium intake from food, talk to your health care provider about whether you should take a daily calcium supplement.  Eat four or five small meals rather than three large meals a day.  Limit foods that are high in fat and processed sugars, such as fried and sweet foods.  To prevent constipation: ? Drink enough fluid to keep your urine clear or pale yellow. ? Eat foods that are high in fiber, such as fresh fruits and vegetables, whole grains, and beans. Activity  Exercise only as directed by your health care provider. Most women can continue their usual exercise routine during pregnancy. Try to exercise for 30 minutes at least 5 days a week. Stop exercising if you experience uterine contractions.  Avoid heavy lifting.  Do   not exercise in extreme heat or humidity, or at high altitudes.  Wear low-heel, comfortable shoes.  Practice good posture.  You may continue to have sex unless your health care provider tells you otherwise. Relieving pain and discomfort  Take frequent breaks and rest with your legs elevated if you have leg cramps or low back pain.  Take warm sitz baths to soothe any pain or discomfort caused by hemorrhoids. Use hemorrhoid cream if your health care provider approves.  Wear a good support bra to prevent discomfort from breast tenderness.  If you develop varicose veins: ? Wear support pantyhose or compression stockings as told by your healthcare provider. ? Elevate your feet for 15 minutes, 3-4 times a day. Prenatal care  Write down your questions. Take them to your prenatal visits.  Keep all your prenatal visits as told by your health care provider. This is important. Safety  Wear your seat belt at all times when driving.  Make  a list of emergency phone numbers, including numbers for family, friends, the hospital, and police and fire departments. General instructions  Avoid cat litter boxes and soil used by cats. These carry germs that can cause birth defects in the baby. If you have a cat, ask someone to clean the litter box for you.  Do not travel far distances unless it is absolutely necessary and only with the approval of your health care provider.  Do not use hot tubs, steam rooms, or saunas.  Do not drink alcohol.  Do not use any products that contain nicotine or tobacco, such as cigarettes and e-cigarettes. If you need help quitting, ask your health care provider.  Do not use any medicinal herbs or unprescribed drugs. These chemicals affect the formation and growth of the baby.  Do not douche or use tampons or scented sanitary pads.  Do not cross your legs for long periods of time.  To prepare for the arrival of your baby: ? Take prenatal classes to understand, practice, and ask questions about labor and delivery. ? Make a trial run to the hospital. ? Visit the hospital and tour the maternity area. ? Arrange for maternity or paternity leave through employers. ? Arrange for family and friends to take care of pets while you are in the hospital. ? Purchase a rear-facing car seat and make sure you know how to install it in your car. ? Pack your hospital bag. ? Prepare the baby's nursery. Make sure to remove all pillows and stuffed animals from the baby's crib to prevent suffocation.  Visit your dentist if you have not gone during your pregnancy. Use a soft toothbrush to brush your teeth and be gentle when you floss. Contact a health care provider if:  You are unsure if you are in labor or if your water has broken.  You become dizzy.  You have mild pelvic cramps, pelvic pressure, or nagging pain in your abdominal area.  You have lower back pain.  You have persistent nausea, vomiting, or  diarrhea.  You have an unusual or bad smelling vaginal discharge.  You have pain when you urinate. Get help right away if:  Your water breaks before 37 weeks.  You have regular contractions less than 5 minutes apart before 37 weeks.  You have a fever.  You are leaking fluid from your vagina.  You have spotting or bleeding from your vagina.  You have severe abdominal pain or cramping.  You have rapid weight loss or weight gain.  You have   shortness of breath with chest pain.  You notice sudden or extreme swelling of your face, hands, ankles, feet, or legs.  Your baby makes fewer than 10 movements in 2 hours.  You have severe headaches that do not go away when you take medicine.  You have vision changes. Summary  The third trimester is from week 28 through week 40, months 7 through 9. The third trimester is a time when the unborn baby (fetus) is growing rapidly.  During the third trimester, your discomfort may increase as you and your baby continue to gain weight. You may have abdominal, leg, and back pain, sleeping problems, and an increased need to urinate.  During the third trimester your breasts will keep growing and they will continue to become tender. A yellow fluid (colostrum) may leak from your breasts. This is the first milk you are producing for your baby.  False labor is a condition in which you feel small, irregular tightenings of the muscles in the womb (contractions) that eventually go away. These are called Braxton Hicks contractions. Contractions may last for hours, days, or even weeks before true labor sets in.  Signs of labor can include: abdominal cramps; regular contractions that start at 10 minutes apart and become stronger and more frequent with time; watery or bloody mucus discharge that comes from the vagina; increased pelvic pressure and dull back pain; and leaking of amniotic fluid. This information is not intended to replace advice given to you by your  health care provider. Make sure you discuss any questions you have with your health care provider. Document Released: 12/15/2000 Document Revised: 01/27/2016 Document Reviewed: 01/27/2016 Elsevier Interactive Patient Education  2019 [employer]  

## 2018-05-25 NOTE — Progress Notes (Signed)
ROB- vaginal pressure for a few weeks

## 2018-06-08 ENCOUNTER — Ambulatory Visit (INDEPENDENT_AMBULATORY_CARE_PROVIDER_SITE_OTHER): Payer: Medicaid Other | Admitting: Certified Nurse Midwife

## 2018-06-08 ENCOUNTER — Other Ambulatory Visit (HOSPITAL_COMMUNITY)
Admission: RE | Admit: 2018-06-08 | Discharge: 2018-06-08 | Disposition: A | Discharge status: Home / Self Care | Payer: Medicaid Other | Source: Ambulatory Visit | Attending: Certified Nurse Midwife | Admitting: Certified Nurse Midwife

## 2018-06-08 ENCOUNTER — Other Ambulatory Visit: Payer: Self-pay

## 2018-06-08 VITALS — BP 118/76 | Wt 200.0 lb

## 2018-06-08 DIAGNOSIS — Z3A35 35 weeks gestation of pregnancy: Secondary | ICD-10-CM

## 2018-06-08 DIAGNOSIS — Z113 Encounter for screening for infections with a predominantly sexual mode of transmission: Secondary | ICD-10-CM | POA: Diagnosis not present

## 2018-06-08 DIAGNOSIS — F1290 Cannabis use, unspecified, uncomplicated: Secondary | ICD-10-CM

## 2018-06-08 DIAGNOSIS — O99013 Anemia complicating pregnancy, third trimester: Secondary | ICD-10-CM

## 2018-06-08 DIAGNOSIS — Z3480 Encounter for supervision of other normal pregnancy, unspecified trimester: Secondary | ICD-10-CM

## 2018-06-08 DIAGNOSIS — Z3685 Encounter for antenatal screening for Streptococcus B: Secondary | ICD-10-CM

## 2018-06-08 LAB — POCT URINALYSIS DIPSTICK OB
Glucose, UA: NEGATIVE
POC,PROTEIN,UA: NEGATIVE

## 2018-06-08 LAB — OB RESULTS CONSOLE GC/CHLAMYDIA: Gonorrhea: NEGATIVE

## 2018-06-08 LAB — OB RESULTS CONSOLE GBS: GBS: NEGATIVE

## 2018-06-08 NOTE — Progress Notes (Signed)
GBS/Aptima today. No vb. No lof.  

## 2018-06-08 NOTE — Progress Notes (Signed)
ROB at 35wk 5 days: Reports good FM. +BH contractions. No vaginal bleeding or LOF. +pressure with BH contractions Taking iron supplements daily. Did not get to see hematology due to Covid 19 pandemic Wants to breast feed. Has Ready Set Baby pamphlet, but breast fed her last baby until she was 33 years old Contraception: pills On exam: FH 37 cm, FHTs WNL. Cephalic on Leopold's Cervix: closed/ 60%.-3/anterior/med Aptima and GBS done Repeat CBC today Labor precautions and [facility] instructions  Farrel Conners, CNM

## 2018-06-09 LAB — CBC WITH DIFFERENTIAL/PLATELET
Basophils Absolute: 0 10*3/uL (ref 0.0–0.2)
Basos: 0 %
EOS (ABSOLUTE): 0.1 10*3/uL (ref 0.0–0.4)
Eos: 1 %
Hematocrit: 24.7 % — ABNORMAL LOW (ref 34.0–46.6)
Hemoglobin: 8.4 g/dL — ABNORMAL LOW (ref 11.1–15.9)
Immature Grans (Abs): 0.2 10*3/uL — ABNORMAL HIGH (ref 0.0–0.1)
Immature Granulocytes: 2 %
Lymphocytes Absolute: 2.6 10*3/uL (ref 0.7–3.1)
Lymphs: 25 %
MCH: 29.9 pg (ref 26.6–33.0)
MCHC: 34 g/dL (ref 31.5–35.7)
MCV: 88 fL (ref 79–97)
Monocytes Absolute: 1 10*3/uL — ABNORMAL HIGH (ref 0.1–0.9)
Monocytes: 9 %
Neutrophils Absolute: 6.6 10*3/uL (ref 1.4–7.0)
Neutrophils: 63 %
Platelets: 265 10*3/uL (ref 150–450)
RBC: 2.81 x10E6/uL — ABNORMAL LOW (ref 3.77–5.28)
RDW: 13.6 % (ref 11.7–15.4)
WBC: 10.5 10*3/uL (ref 3.4–10.8)

## 2018-06-12 LAB — CULTURE, BETA STREP (GROUP B ONLY): Strep Gp B Culture: NEGATIVE

## 2018-06-12 LAB — CERVICOVAGINAL ANCILLARY ONLY
Chlamydia: NEGATIVE
Neisseria Gonorrhea: NEGATIVE
Trichomonas: NEGATIVE

## 2018-06-13 LAB — URINE DRUG PANEL 7
Amphetamines, Urine: NEGATIVE ng/mL
Barbiturate Quant, Ur: NEGATIVE ng/mL
Benzodiazepine Quant, Ur: NEGATIVE ng/mL
Cannabinoid Quant, Ur: POSITIVE — AB
Cocaine (Metab.): NEGATIVE ng/mL
Opiate Quant, Ur: NEGATIVE ng/mL
PCP Quant, Ur: NEGATIVE ng/mL

## 2018-06-14 ENCOUNTER — Other Ambulatory Visit: Payer: Self-pay | Admitting: Certified Nurse Midwife

## 2018-06-14 ENCOUNTER — Other Ambulatory Visit: Payer: Medicaid Other

## 2018-06-14 DIAGNOSIS — O99013 Anemia complicating pregnancy, third trimester: Secondary | ICD-10-CM

## 2018-06-14 DIAGNOSIS — D582 Other hemoglobinopathies: Secondary | ICD-10-CM

## 2018-06-15 ENCOUNTER — Other Ambulatory Visit: Payer: Self-pay | Admitting: Certified Nurse Midwife

## 2018-06-15 ENCOUNTER — Other Ambulatory Visit: Payer: Medicaid Other

## 2018-06-15 ENCOUNTER — Other Ambulatory Visit: Payer: Self-pay

## 2018-06-16 ENCOUNTER — Encounter: Payer: Self-pay | Admitting: Emergency Medicine

## 2018-06-16 ENCOUNTER — Other Ambulatory Visit: Payer: Self-pay

## 2018-06-16 ENCOUNTER — Observation Stay
Admission: EM | Admit: 2018-06-16 | Discharge: 2018-06-16 | Disposition: A | Discharge status: Home / Self Care | Payer: Medicaid Other | Attending: Certified Nurse Midwife | Admitting: Certified Nurse Midwife

## 2018-06-16 ENCOUNTER — Telehealth: Payer: Self-pay

## 2018-06-16 ENCOUNTER — Encounter: Payer: Medicaid Other | Admitting: Obstetrics and Gynecology

## 2018-06-16 DIAGNOSIS — R55 Syncope and collapse: Secondary | ICD-10-CM | POA: Diagnosis not present

## 2018-06-16 DIAGNOSIS — O9989 Other specified diseases and conditions complicating pregnancy, childbirth and the puerperium: Secondary | ICD-10-CM | POA: Diagnosis present

## 2018-06-16 DIAGNOSIS — F17210 Nicotine dependence, cigarettes, uncomplicated: Secondary | ICD-10-CM | POA: Diagnosis not present

## 2018-06-16 DIAGNOSIS — O99019 Anemia complicating pregnancy, unspecified trimester: Secondary | ICD-10-CM | POA: Diagnosis present

## 2018-06-16 DIAGNOSIS — O99013 Anemia complicating pregnancy, third trimester: Principal | ICD-10-CM | POA: Insufficient documentation

## 2018-06-16 DIAGNOSIS — D649 Anemia, unspecified: Secondary | ICD-10-CM | POA: Diagnosis not present

## 2018-06-16 DIAGNOSIS — O99333 Smoking (tobacco) complicating pregnancy, third trimester: Secondary | ICD-10-CM | POA: Insufficient documentation

## 2018-06-16 DIAGNOSIS — O99891 Other specified diseases and conditions complicating pregnancy: Secondary | ICD-10-CM

## 2018-06-16 DIAGNOSIS — E860 Dehydration: Secondary | ICD-10-CM

## 2018-06-16 DIAGNOSIS — D582 Other hemoglobinopathies: Secondary | ICD-10-CM

## 2018-06-16 DIAGNOSIS — Z3480 Encounter for supervision of other normal pregnancy, unspecified trimester: Secondary | ICD-10-CM

## 2018-06-16 DIAGNOSIS — Z3A36 36 weeks gestation of pregnancy: Secondary | ICD-10-CM

## 2018-06-16 DIAGNOSIS — R7989 Other specified abnormal findings of blood chemistry: Secondary | ICD-10-CM | POA: Diagnosis present

## 2018-06-16 DIAGNOSIS — O26893 Other specified pregnancy related conditions, third trimester: Secondary | ICD-10-CM

## 2018-06-16 DIAGNOSIS — E538 Deficiency of other specified B group vitamins: Secondary | ICD-10-CM | POA: Diagnosis present

## 2018-06-16 DIAGNOSIS — R4020 Unspecified coma: Secondary | ICD-10-CM

## 2018-06-16 DIAGNOSIS — R8271 Bacteriuria: Secondary | ICD-10-CM

## 2018-06-16 LAB — IRON,TIBC AND FERRITIN PANEL
Ferritin: 70 ng/mL (ref 15–150)
Iron Saturation: 32 % (ref 15–55)
Iron: 122 ug/dL (ref 27–159)
Total Iron Binding Capacity: 383 ug/dL (ref 250–450)
UIBC: 261 ug/dL (ref 131–425)

## 2018-06-16 LAB — URINALYSIS, COMPLETE (UACMP) WITH MICROSCOPIC
Bilirubin Urine: NEGATIVE
Glucose, UA: NEGATIVE mg/dL
Ketones, ur: NEGATIVE mg/dL
Leukocytes,Ua: NEGATIVE
Nitrite: NEGATIVE
Protein, ur: 30 mg/dL — AB
Specific Gravity, Urine: 1.004 — ABNORMAL LOW (ref 1.005–1.030)
pH: 6 (ref 5.0–8.0)

## 2018-06-16 LAB — CBC
HCT: 22.6 % — ABNORMAL LOW (ref 36.0–46.0)
Hemoglobin: 8.1 g/dL — ABNORMAL LOW (ref 12.0–15.0)
MCH: 30.1 pg (ref 26.0–34.0)
MCHC: 35.8 g/dL (ref 30.0–36.0)
MCV: 84 fL (ref 80.0–100.0)
Platelets: 281 10*3/uL (ref 150–400)
RBC: 2.69 MIL/uL — ABNORMAL LOW (ref 3.87–5.11)
RDW: 13.6 % (ref 11.5–15.5)
WBC: 9.6 10*3/uL (ref 4.0–10.5)
nRBC: 0 % (ref 0.0–0.2)

## 2018-06-16 LAB — BASIC METABOLIC PANEL
Anion gap: 7 (ref 5–15)
BUN: 7 mg/dL (ref 6–20)
CO2: 20 mmol/L — ABNORMAL LOW (ref 22–32)
Calcium: 9.1 mg/dL (ref 8.9–10.3)
Chloride: 110 mmol/L (ref 98–111)
Creatinine, Ser: 0.63 mg/dL (ref 0.44–1.00)
GFR calc Af Amer: >60 mL/min (ref >60)
GFR calc non Af Amer: >60 mL/min (ref >60)
Glucose, Bld: 94 mg/dL (ref 70–99)
Potassium: 3.9 mmol/L (ref 3.5–5.1)
Sodium: 137 mmol/L (ref 135–145)

## 2018-06-16 LAB — B12 AND FOLATE PANEL
Folate: 5.4 ng/mL (ref >3.0)
Vitamin B-12: 114 pg/mL — ABNORMAL LOW (ref 232–1245)

## 2018-06-16 MED ORDER — LACTATED RINGERS IV BOLUS
1000.0000 mL | Freq: Once | INTRAVENOUS | Status: AC
  Administered 2018-06-16: 1000 mL via INTRAVENOUS

## 2018-06-16 MED ORDER — ACETAMINOPHEN 325 MG PO TABS: 650.0000 mg | ORAL_TABLET | Freq: Four times a day (QID) | ORAL | Status: DC | PRN

## 2018-06-16 MED ORDER — SODIUM CHLORIDE 0.9% FLUSH: 3.0000 mL | Freq: Once | INTRAVENOUS | Status: DC

## 2018-06-16 MED ORDER — CEPHALEXIN 500 MG PO CAPS: 500.0000 mg | ORAL_CAPSULE | Freq: Three times a day (TID) | ORAL | 0 refills | Status: AC

## 2018-06-16 MED ORDER — VITAMIN B-12 1000 MCG PO TABS: 1000.0000 ug | ORAL_TABLET | Freq: Every day | ORAL | 2 refills | Status: AC

## 2018-06-16 NOTE — Final Progress Note (Signed)
Physician Final Progress Note  Patient ID: Dana Chen MRN: [id number] DOB/AGE: [birth year] 33 y.o.  Admit date: 06/16/2018 Admitting provider: Vena AustriaAndreas Eduar Kumpf, MD Discharge date: 06/16/2018   Admission Diagnoses: Syncopal episode  Discharge Diagnoses:  Active Problems:   Anemia in pregnancy   Low vitamin B12 level  32 y.o. [id number] at 6740w6d presenting with syncopal episode today at work.  She has a similar episode a few week ago at home but felt better when she laid down.  She did not have any abdominal trauma with her fall and was cleared by the ED.  Laboratory evaluation reveal anemia which was previously noted.  The patient is also deficient in B12 with hemonc consult pending.  Fetal monitoring reassuring with +FM, no LOF, no VB, no ctx.  Written out of work given two syncopal episodes likely vasovagal episodes compounded by venous compression during pregnancy and anemia.    Blood pressure 108/61, pulse 73, temperature 98.1 F (36.7 C), temperature source Oral, resp. rate 16, height 5\' 5"  (1.651 m), weight 90.7 kg, last menstrual period 10/01/2017, SpO2 99 %.     Consults: None  Significant Findings/ Diagnostic Studies:  Results for orders placed or performed during the hospital encounter of 06/16/18 (from the past 24 hour(s))  Basic metabolic panel     Status: Abnormal   Collection Time: 06/16/18 12:57 PM  Result Value Ref Range   Sodium 137 135 - 145 mmol/L   Potassium 3.9 3.5 - 5.1 mmol/L   Chloride 110 98 - 111 mmol/L   CO2 20 (L) 22 - 32 mmol/L   Glucose, Bld 94 70 - 99 mg/dL   BUN 7 6 - 20 mg/dL   Creatinine, Ser 2.950.63 0.44 - 1.00 mg/dL   Calcium 9.1 8.9 - 62.110.3 mg/dL   GFR calc non Af Amer >60 >60 mL/min   GFR calc Af Amer >60 >60 mL/min   Anion gap 7 5 - 15  CBC     Status: Abnormal   Collection Time: 06/16/18 12:57 PM  Result Value Ref Range   WBC 9.6 4.0 - 10.5 K/uL   RBC 2.69 (L) 3.87 - 5.11 MIL/uL   Hemoglobin 8.1 (L) 12.0 - 15.0 g/dL   HCT 30.822.6 (L) 65.736.0 -  46.0 %   MCV 84.0 80.0 - 100.0 fL   MCH 30.1 26.0 - 34.0 pg   MCHC 35.8 30.0 - 36.0 g/dL   RDW 84.613.6 96.211.5 - 95.215.5 %   Platelets 281 150 - 400 K/uL   nRBC 0.0 0.0 - 0.2 %  Urinalysis, Complete w Microscopic     Status: Abnormal   Collection Time: 06/16/18  3:18 PM  Result Value Ref Range   Color, Urine YELLOW (A) YELLOW   APPearance CLOUDY (A) CLEAR   Specific Gravity, Urine 1.004 (L) 1.005 - 1.030   pH 6.0 5.0 - 8.0   Glucose, UA NEGATIVE NEGATIVE mg/dL   Hgb urine dipstick SMALL (A) NEGATIVE   Bilirubin Urine NEGATIVE NEGATIVE   Ketones, ur NEGATIVE NEGATIVE mg/dL   Protein, ur 30 (A) NEGATIVE mg/dL   Nitrite NEGATIVE NEGATIVE   Leukocytes,Ua NEGATIVE NEGATIVE   RBC / HPF 0-5 0 - 5 RBC/hpf   WBC, UA 0-5 0 - 5 WBC/hpf   Bacteria, UA RARE (A) NONE SEEN   Squamous Epithelial / LPF 21-50 0 - 5   Mucus PRESENT      Procedures:  Baseline: 145 Variability: moderate Accelerations: present Decelerations: absent The patient was monitored for 30 minutes, fetal heart  rate tracing was deemed reactive, category I tracing,  Discharge Condition: good  Disposition: Discharge disposition: 01-Home or Self Care       Diet: Regular diet  Discharge Activity: Activity as tolerated  Discharge Instructions    Discharge activity:  No Restrictions   Complete by: As directed    Discharge diet:  No restrictions   Complete by: As directed    Fetal Kick Count:  Lie on our left side for one hour after a meal, and count the number of times your baby kicks.  If it is less than 5 times, get up, move around and drink some juice.  Repeat the test 30 minutes later.  If it is still less than 5 kicks in an hour, notify your doctor.   Complete by: As directed    LABOR:  When conractions begin, you should start to time them from the beginning of one contraction to the beginning  of the next.  When contractions are 5 - 10 minutes apart or less and have been regular for at least an hour, you should call  your health care provider.   Complete by: As directed    No sexual activity restrictions   Complete by: As directed    Notify physician for bleeding from the vagina   Complete by: As directed    Notify physician for blurring of vision or spots before the eyes   Complete by: As directed    Notify physician for chills or fever   Complete by: As directed    Notify physician for fainting spells, "black outs" or loss of consciousness   Complete by: As directed    Notify physician for increase in vaginal discharge   Complete by: As directed    Notify physician for leaking of fluid   Complete by: As directed    Notify physician for pain or burning when urinating   Complete by: As directed    Notify physician for pelvic pressure (sudden increase)   Complete by: As directed    Notify physician for severe or continued nausea or vomiting   Complete by: As directed    Notify physician for sudden gushing of fluid from the vagina (with or without continued leaking)   Complete by: As directed    Notify physician for sudden, constant, or occasional abdominal pain   Complete by: As directed    Notify physician if baby moving less than usual   Complete by: As directed      Allergies as of 06/16/2018      Reactions   Peanut Oil Hives   Strawberry Extract Hives      Medication List    TAKE these medications   cephALEXin 500 MG capsule Commonly known as: KEFLEX Take 1 capsule (500 mg total) by mouth 3 (three) times daily for 7 days.   ferrous sulfate 325 (65 FE) MG tablet Commonly known as: FerrouSul Take 1 tablet (325 mg total) by mouth daily with breakfast.   ondansetron 4 MG disintegrating tablet Commonly known as: ZOFRAN-ODT TAKE 1 TABLET BY MOUTH EVERY 8 HOURS AS NEEDED FOR NAUSEA FOR UP TO 7 DAYS   Prenatal Vitamins 28-0.8 MG Tabs Take 1 tablet by mouth daily.   vitamin B-12 1000 MCG tablet Commonly known as: CYANOCOBALAMIN Take 1 tablet (1,000 mcg total) by mouth daily.       Follow-up Information    OBGYN.   Contact information: Go upstairs to [facility] department for fetal monitoring  Total time spent taking care of this patient: 30 minutes  Signed: Vena Austriandreas Raman Featherston 06/16/2018, 6:15 PM

## 2018-06-16 NOTE — OB Triage Note (Signed)
Pt G3P2 [redacted]w[redacted]d presents to L&D for a fall that occurred this AM. Pt was cleared in the ER and sent to L&D for monitoring post fall. She states she is not sure of many details regarding the fall because she blacked out and an employee found her. 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 She said she clocked in to work this AM around 1000 and clocked out around 1100. She states that her head hurts above her right eye and is bruised. She states she is unsure, but "does not think she hit her abdomen". She reports + FM and denies any ctx or LOF. VSS. Monitors applied and assessing.

## 2018-06-16 NOTE — ED Triage Notes (Signed)
Patient presents to the ED post syncopal episode at work.  Patient hit her head.  Patient reports feeling contractions.  Patient is 36 weeks and 6days pregnant.  Patient has hematoma to her forehead.  Patient denies vaginal bleeding and fluid leaking.  Patient reports feeling baby move.

## 2018-06-16 NOTE — ED Notes (Signed)
Pt given ice water, ok per Dr. Ellender Hose.

## 2018-06-16 NOTE — Discharge Summary (Signed)
See final progress note. 

## 2018-06-16 NOTE — ED Notes (Signed)
Pt reports syncopal episode while at work today. Pt states that she was talking to a co-worker and then woke up on the floor. Pt has hematoma to the right eyebrow.

## 2018-06-16 NOTE — Telephone Encounter (Signed)
Pt called office; tx'd to me; c/o was at work; got overheated; drank some water and some tea; next thing she knew she was picking herself up off the floor and had a know on her head.  Work sent her home.  Pt is anxious.  No vaginal bleeding; has good FM.  Per [facility] pt adv to go to L&D.  Adv to enter via ED.  May be allowed one person c her - not sure.

## 2018-06-16 NOTE — ED Provider Notes (Signed)
[facility] Emergency Department Provider Note  ____________________________________________   First MD Initiated Contact with Patient 06/16/18 1244     (approximate)  I have reviewed the triage vital signs and the nursing notes.   HISTORY  Chief Complaint Loss of Consciousness    HPI Dana Chen is a 33 y.o. female G3, P2 currently [redacted] weeks pregnant here with syncopal episode.  The patient was working in a hot [employer].  She was standing on her feet.  She states that earlier today, she felt mildly lightheaded when standing, had to sit down and drink some liquid.  She felt better.  She then returned to work and began to feel lightheaded very briefly before having what appears to be a witnessed loss of consciousness.  She states she woke up and she is on the ground.  She struck her right brow.  She denies any significant pain.  She continues to feel baby move and denies any vaginal bleeding or leakage of fluid.  No chest pain.  No palpitations.  No history of previous syncopal episodes. She now feels back to her baseline.  There is no tongue biting or seizure-like activity.       History reviewed. No pertinent past medical history.  Patient Active Problem List   Diagnosis Date Noted  . 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 Supervision of other normal pregnancy, antepartum 02/03/2018  . Tobacco smoking affecting pregnancy, antepartum 02/03/2018  . Marijuana use 02/03/2018    History reviewed. No pertinent surgical history.  Prior to Admission medications   Medication Sig Start Date End Date Taking? Authorizing Provider  cephALEXin (KEFLEX) 500 MG capsule Take 1 capsule (500 mg total) by mouth 3 (three) times daily for 7 days. 06/16/18 06/23/18  Duffy Bruce, MD  ferrous sulfate (FERROUSUL) 325 (65 FE) MG tablet Take 1 tablet (325 mg total) by mouth daily with breakfast. 04/24/18   Rod Can, CNM  ondansetron (ZOFRAN-ODT) 4 MG disintegrating  tablet TAKE 1 TABLET BY MOUTH EVERY 8 HOURS AS NEEDED FOR NAUSEA FOR UP TO 7 DAYS 01/09/18   [provider]  Prenatal Vit-Fe Fumarate-FA (PRENATAL VITAMINS) 28-0.8 MG TABS Take 1 tablet by mouth daily. 11/01/17   [provider]    Allergies Peanut oil and Strawberry extract  Family History  Problem Relation Age of Onset  . Brain cancer Mother     Social History Social History   Tobacco Use  . Smoking status: Current Every Day Smoker    Packs/day: 1.00    Types: Cigarettes  . Smokeless tobacco: Never Used  Substance Use Topics  . Alcohol use: Yes    Comment: social, occasional   . Drug use: Yes    Frequency: 2.0 times per week    Types: Marijuana    Review of Systems   Review of Systems  Constitutional: Positive for fatigue. Negative for chills and fever.  HENT: Negative for congestion and rhinorrhea.   Respiratory: Negative for shortness of breath.   Cardiovascular: Negative for chest pain.  Gastrointestinal: Negative for diarrhea, nausea and vomiting.  Musculoskeletal: Negative for neck pain.  Skin: Negative for rash and wound.  Allergic/Immunologic: Negative for immunocompromised state.  Neurological: Positive for syncope. Negative for weakness and numbness.  Hematological: Does not bruise/bleed easily.     ____________________________________________  PHYSICAL EXAM:     VITAL SIGNS: ED Triage Vitals [06/16/18 1251]  Enc Vitals Group     BP 119/76     Pulse Rate 89  Resp 18     Temp 98.2 F (36.8 C)     Temp src      SpO2 99 %     Weight      Height      Head Circumference      Peak Flow      Pain Score 7     Pain Loc      Pain Edu?      Excl. in GC?      Physical Exam Vitals signs and nursing note reviewed.  Constitutional:      General: She is not in acute distress.    Appearance: She is well-developed.  HENT:     Head: Normocephalic and atraumatic.     Comments: Mildly dry MM Eyes:     Conjunctiva/sclera: Conjunctivae  normal.  Neck:     Musculoskeletal: Neck supple.  Cardiovascular:     Rate and Rhythm: Normal rate and regular rhythm.     Heart sounds: Normal heart sounds. No murmur. No friction rub.  Pulmonary:     Effort: Pulmonary effort is normal. No respiratory distress.     Breath sounds: Normal breath sounds. No wheezing or rales.  Abdominal:     General: There is no distension.     Palpations: Abdomen is soft.     Tenderness: There is no abdominal tenderness.     Comments: +Gravid, normal FM appreciated. No TTP.  Skin:    General: Skin is warm.     Capillary Refill: Capillary refill takes less than 2 seconds.  Neurological:     Mental Status: She is alert and oriented to person, place, and time.     GCS: GCS eye subscore is 4. GCS verbal subscore is 5. GCS motor subscore is 6.     Sensory: Sensation is intact.     Motor: Motor function is intact. No abnormal muscle tone.       ____________________________________________   LABS (all labs ordered are listed, but only abnormal results are displayed)  Labs Reviewed  BASIC METABOLIC PANEL - Abnormal; Notable for the following components:      Result Value   CO2 20 (*)    All other components within normal limits  CBC - Abnormal; Notable for the following components:   RBC 2.69 (*)    Hemoglobin 8.1 (*)    HCT 22.6 (*)    All other components within normal limits  URINALYSIS, COMPLETE (UACMP) WITH MICROSCOPIC - Abnormal; Notable for the following components:   Color, Urine YELLOW (*)    APPearance CLOUDY (*)    Specific Gravity, Urine 1.004 (*)    Hgb urine dipstick SMALL (*)    Protein, ur 30 (*)    Bacteria, UA RARE (*)    All other components within normal limits  CBG MONITORING, ED    ____________________________________________  EKG: None ________________________________________  RADIOLOGY All imaging, including plain films, CT scans, and ultrasounds, independently reviewed by me, and interpretations confirmed via  formal radiology reads.  ED MD interpretation:   None  Official radiology report(s): No results found.  ____________________________________________  PROCEDURES   Procedure(s) performed (including Critical Care):  Procedures  ____________________________________________  INITIAL IMPRESSION / MDM / ASSESSMENT AND PLAN / ED COURSE  As part of my medical decision making, I reviewed the following data within the electronic MEDICAL RECORD NUMBER Notes from prior ED visits and [place] Controlled Substance Database      *Dana Chen was evaluated in Emergency Department on 06/16/2018 for  the symptoms described in the history of present illness. She was evaluated in the context of the global COVID-19 pandemic, which necessitated consideration that the patient might be at risk for infection with the SARS-CoV-2 virus that causes COVID-19. Institutional protocols and algorithms that pertain to the evaluation of patients at risk for COVID-19 are in a state of rapid change based on information released by regulatory bodies including the CDC and federal and state organizations. These policies and algorithms were followed during the patient's care in the ED.  Some ED evaluations and interventions may be delayed as a result of limited staffing during the pandemic.*      Medical Decision Making: 33 yo F here with syncopal episode. I suspect this is 2/2 orthostasis and impaired venous return from pregnancy. She had an appropriate prodrome. No focal neuro deficits. EKG non-ischemic w/ normal intervals. Labs show chronic anemia, mildly worse though no bleeding - this is likely exacerbating her decreased venous return. Continue iron. Asymptomatic bacteriuria noted - will treat with Keflex. Discussed with Dr. Lindell SparStapler of OB - will have her go upstairs for toco/monitroing. Return precautions given.  ____________________________________________  FINAL CLINICAL IMPRESSION(S) / ED DIAGNOSES  Final diagnoses:  Loss  of consciousness (HCC)  Dehydration  Bacteriuria during pregnancy     MEDICATIONS GIVEN DURING THIS VISIT:  Medications  sodium chloride flush (NS) 0.9 % injection 3 mL (has no administration in time range)  lactated ringers bolus 1,000 mL (0 mLs Intravenous Stopped 06/16/18 1602)     ED Discharge Orders         Ordered    cephALEXin (KEFLEX) 500 MG capsule  3 times daily     06/16/18 1604           Note:  This document was prepared using Dragon voice recognition software and may include unintentional dictation errors.   Shaune PollackIsaacs, Melina Mosteller, MD 06/16/18 [phone]

## 2018-06-19 ENCOUNTER — Other Ambulatory Visit: Payer: Self-pay

## 2018-06-19 ENCOUNTER — Ambulatory Visit (INDEPENDENT_AMBULATORY_CARE_PROVIDER_SITE_OTHER): Payer: Medicaid Other | Admitting: Obstetrics and Gynecology

## 2018-06-19 ENCOUNTER — Encounter: Payer: Self-pay | Admitting: Obstetrics and Gynecology

## 2018-06-19 VITALS — BP 122/74 | Wt 201.0 lb

## 2018-06-19 DIAGNOSIS — E538 Deficiency of other specified B group vitamins: Secondary | ICD-10-CM

## 2018-06-19 DIAGNOSIS — F1290 Cannabis use, unspecified, uncomplicated: Secondary | ICD-10-CM

## 2018-06-19 DIAGNOSIS — Z3A37 37 weeks gestation of pregnancy: Secondary | ICD-10-CM

## 2018-06-19 DIAGNOSIS — O99330 Smoking (tobacco) complicating pregnancy, unspecified trimester: Secondary | ICD-10-CM

## 2018-06-19 DIAGNOSIS — O99333 Smoking (tobacco) complicating pregnancy, third trimester: Secondary | ICD-10-CM

## 2018-06-19 DIAGNOSIS — O99013 Anemia complicating pregnancy, third trimester: Secondary | ICD-10-CM

## 2018-06-19 DIAGNOSIS — D582 Other hemoglobinopathies: Secondary | ICD-10-CM

## 2018-06-19 DIAGNOSIS — Z3480 Encounter for supervision of other normal pregnancy, unspecified trimester: Secondary | ICD-10-CM

## 2018-06-19 NOTE — Progress Notes (Signed)
  Routine Prenatal Care Visit  Subjective  Dana Chen is a 33 y.o. G3P2002 at [redacted]w[redacted]d being seen today for ongoing prenatal care.  She is currently monitored for the following issues for this high-risk pregnancy and has Supervision of other normal pregnancy, antepartum; Tobacco smoking affecting pregnancy, antepartum; Marijuana use; Hemoglobin C trait ([place]); Anemia in pregnancy; and Low vitamin B12 level on their problem list.  ----------------------------------------------------------------------------------- Patient reports no complaints.   Contractions: Irregular. Vag. Bleeding: None.  Movement: Present. Denies leaking of fluid.  Recent syncopal episode. Cleared by ED and L&D on 6/11.  No complaints today.  ----------------------------------------------------------------------------------- The following portions of the patient's history were reviewed and updated as appropriate: allergies, current medications, past family history, past medical history, past social history, past surgical history and problem list. Problem list updated.   Objective  Blood pressure 122/74, weight 201 lb (91.2 kg), last menstrual period 10/01/2017. Pregravid weight 163 lb (73.9 kg) Total Weight Gain 38 lb (17.2 kg) Urinalysis: Urine Protein    Urine Glucose    Fetal Status: Fetal Heart Rate (bpm): 125 Fundal Height: 37 cm Movement: Present     General:  Alert, oriented and cooperative. Patient is in no acute distress.  Skin: Skin is warm and dry. No rash noted.   Cardiovascular: Normal heart rate noted  Respiratory: Normal respiratory effort, no problems with respiration noted  Abdomen: Soft, gravid, appropriate for gestational age. Pain/Pressure: Present     Pelvic:  Cervical exam deferred        Extremities: Normal range of motion.  Edema: None  Mental Status: Normal mood and affect. Normal behavior. Normal judgment and thought content.   Assessment   33 y.o. 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 by Malachy Mood, MD No   Supervision of other normal pregnancy, antepartum 02/03/2018 by Rexene Agent, CNM No   Overview Addendum 06/08/2018  9:41 AM by Dalia Heading, [place] Prenatal Labs  Dating LMP = 19 week [place] Blood type: O/Positive/-- (01/31 1445)   Genetic Screen NIPS: Normal XX Antibody:Negative (01/31 1445)  Anatomic [place] Normal Rubella: 2.50 (01/31 1445) Varicella: Immune  GTT 150, and normal 3 hour GTT RPR: Non Reactive (01/31 1445)   Rhogam N/A HBsAg: Negative (01/31 1445)   TDaP vaccine 04/12/2018  Flu Shot: HIV: Non Reactive (01/31 1445)   Baby Food           Breast                     GBS:   Contraception Pills Pap:  CBB   28 week H&H 8.5/25%. HAs hemoglobin C trait  CS/VBAC N/A   Support Person                  Preterm labor symptoms and general obstetric precautions including but not limited to vaginal bleeding, contractions, leaking of fluid and fetal movement were reviewed in detail with the patient. Please refer to After Visit Summary for other counseling recommendations.   Return in about 1 week (around 06/26/2018) for Routine Prenatal Appointment.  Prentice Docker, MD, [facility], [facility] 06/19/2018 10:55 AM

## 2018-06-22 ENCOUNTER — Encounter: Payer: Self-pay | Admitting: Oncology

## 2018-06-22 ENCOUNTER — Other Ambulatory Visit: Payer: Self-pay

## 2018-06-22 ENCOUNTER — Inpatient Hospital Stay: Payer: Medicaid Other | Admitting: Oncology

## 2018-06-22 NOTE — Progress Notes (Signed)
Patient contacted for telehelath visit to establish care for anemia. She is [redacted] weeks pregnant and states she feels tired most of the time. Patient currently on antibiotic due to fluid in ears. Pt having technical difficulties connecting for telehealth vist. Will reschedule to in person visit next week.

## 2018-06-26 ENCOUNTER — Inpatient Hospital Stay: Payer: Medicaid Other | Attending: Oncology | Admitting: Oncology

## 2018-06-27 ENCOUNTER — Ambulatory Visit (INDEPENDENT_AMBULATORY_CARE_PROVIDER_SITE_OTHER): Payer: Medicaid Other | Admitting: Obstetrics and Gynecology

## 2018-06-27 ENCOUNTER — Other Ambulatory Visit: Payer: Self-pay

## 2018-06-27 ENCOUNTER — Encounter: Payer: Self-pay | Admitting: Obstetrics and Gynecology

## 2018-06-27 VITALS — BP 120/78 | Wt 202.0 lb

## 2018-06-27 DIAGNOSIS — O99013 Anemia complicating pregnancy, third trimester: Secondary | ICD-10-CM

## 2018-06-27 DIAGNOSIS — Z3480 Encounter for supervision of other normal pregnancy, unspecified trimester: Secondary | ICD-10-CM

## 2018-06-27 DIAGNOSIS — F17210 Nicotine dependence, cigarettes, uncomplicated: Secondary | ICD-10-CM

## 2018-06-27 DIAGNOSIS — Z3A38 38 weeks gestation of pregnancy: Secondary | ICD-10-CM

## 2018-06-27 DIAGNOSIS — O99333 Smoking (tobacco) complicating pregnancy, third trimester: Secondary | ICD-10-CM

## 2018-06-27 DIAGNOSIS — O99330 Smoking (tobacco) complicating pregnancy, unspecified trimester: Secondary | ICD-10-CM

## 2018-06-27 NOTE — Progress Notes (Signed)
ROB C/o braxton hicks Denies lof, no vb, Good FM 

## 2018-06-27 NOTE — Progress Notes (Signed)
    Routine Prenatal Care Visit  Subjective  Dana Chen is a 33 y.o. G3P2002 at [redacted]w[redacted]d being seen today for ongoing prenatal care.  She is currently monitored for the following issues for this low-risk pregnancy and has Supervision of other normal pregnancy, antepartum; Tobacco smoking affecting pregnancy, antepartum; Marijuana use; Hemoglobin C trait ([place]); Anemia in pregnancy; and Low vitamin B12 level on their problem list.  ----------------------------------------------------------------------------------- Patient reports no complaints.   Contractions: Not present. Vag. Bleeding: None.  Movement: Present. Denies leaking of fluid.  ----------------------------------------------------------------------------------- The following portions of the patient's history were reviewed and updated as appropriate: allergies, current medications, past family history, past medical history, past social history, past surgical history and problem list. Problem list updated.   Objective  Blood pressure 120/78, weight 202 lb (91.6 kg), last menstrual period 10/01/2017. Pregravid weight 163 lb (73.9 kg) Total Weight Gain 39 lb (17.7 kg) Urinalysis:      Fetal Status: Fetal Heart Rate (bpm): 140 Fundal Height: 40 cm Movement: Present  Presentation: Vertex  General:  Alert, oriented and cooperative. Patient is in no acute distress.  Skin: Skin is warm and dry. No rash noted.   Cardiovascular: Normal heart rate noted  Respiratory: Normal respiratory effort, no problems with respiration noted  Abdomen: Soft, gravid, appropriate for gestational age. Pain/Pressure: Absent     Pelvic:  Cervical exam performed Dilation: Closed Effacement (%): 0 Station: -3 Membranes swept at maternal request.  Extremities: Normal range of motion.  Edema: None  Mental Status: Normal mood and affect. Normal behavior. Normal judgment and thought content.     Assessment   33 y.o. 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 by Malachy Mood, MD No   Supervision of other normal pregnancy, antepartum 02/03/2018 by Rexene Agent, CNM No   Overview Addendum 06/08/2018  9:41 AM by Dalia Heading, [place] Prenatal Labs  Dating LMP = 19 week [place] Blood type: O/Positive/-- (01/31 1445)   Genetic Screen NIPS: Normal XX Antibody:Negative (01/31 1445)  Anatomic [place] Normal Rubella: 2.50 (01/31 1445) Varicella: Immune  GTT 150, and normal 3 hour GTT RPR: Non Reactive (01/31 1445)   Rhogam N/A HBsAg: Negative (01/31 1445)   TDaP vaccine 04/12/2018  Flu Shot: HIV: Non Reactive (01/31 1445)   Baby Food           Breast                     GBS:   Contraception Pills Pap:  CBB   28 week H&H 8.5/25%. HAs hemoglobin C trait  CS/VBAC N/A   Support Person                  Gestational age appropriate obstetric precautions including but not limited to vaginal bleeding, contractions, leaking of fluid and fetal movement were reviewed in detail with the patient.    Return in about 1 week (around 07/04/2018) for ROB in person.  Homero Fellers MD [facility], [facility] 06/27/2018, 9:35 AM

## 2018-06-28 ENCOUNTER — Encounter: Payer: Medicaid Other | Admitting: Oncology

## 2018-07-04 ENCOUNTER — Ambulatory Visit (INDEPENDENT_AMBULATORY_CARE_PROVIDER_SITE_OTHER): Payer: Medicaid Other | Admitting: Maternal Newborn

## 2018-07-04 ENCOUNTER — Other Ambulatory Visit: Payer: Self-pay

## 2018-07-04 ENCOUNTER — Encounter: Payer: Self-pay | Admitting: Maternal Newborn

## 2018-07-04 VITALS — BP 120/80 | Wt 203.0 lb

## 2018-07-04 DIAGNOSIS — Z3A39 39 weeks gestation of pregnancy: Secondary | ICD-10-CM

## 2018-07-04 DIAGNOSIS — Z3483 Encounter for supervision of other normal pregnancy, third trimester: Secondary | ICD-10-CM

## 2018-07-04 DIAGNOSIS — Z3480 Encounter for supervision of other normal pregnancy, unspecified trimester: Secondary | ICD-10-CM

## 2018-07-04 NOTE — Patient Instructions (Signed)

## 2018-07-04 NOTE — Progress Notes (Signed)
Obstetrics Admission History & Physical   Elective induction of labor  HPI:  33 y.o. [id number] @ [redacted]w[redacted]d (07/08/2018, by Last Menstrual Period).  Patient Active Problem List   Diagnosis Date Noted  . Anemia in pregnancy 06/16/2018  . Low vitamin B12 level 06/16/2018  . 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 Supervision of other normal pregnancy, antepartum 02/03/2018  . Tobacco smoking affecting pregnancy, antepartum 02/03/2018  . Marijuana use 02/03/2018     Presents for elective induction of labor at term. She is having infrequent contractions. She does not have leaking of fluid or vaginal bleeding. Good fetal movement.  Prenatal care at: at [facility]. Pregnancy complicated by anemia, tobacco use, marijuana use in early pregnancy.  ROS: A review of systems was performed and negative, except as stated in the above HPI.  PMHx:  Past Medical History:  Diagnosis Date  . Anemia    PSHx:  Past Surgical History:  Procedure Laterality Date  . WISDOM TOOTH EXTRACTION     Medications: (Not in a hospital admission)  Allergies: is allergic to peanut oil and strawberry extract. OBHx:  OB History  Gravida Para Term Preterm AB Living  3 2 2  0 0 2  SAB TAB Ectopic Multiple Live Births          2    # Outcome Date GA Lbr Len/2nd Weight Sex Delivery Anes PTL Lv  3 Current           2 Term 12/29/00 [redacted]w[redacted]d  7 lb 14 oz (3.572 kg) F Vag-Spont   LIV  1 Term 01/25/99 [redacted]w[redacted]d  9 lb 2 oz (4.139 kg) M Vag-Forceps EPI  LIV    Obstetric Comments  IOL with G1 for ? Fetal macrosomia/ polyhydranios   FHx:  Family History  Problem Relation Age of Onset  . Brain cancer Mother   . Prostate cancer Maternal Great-grandfather   . Breast cancer Maternal Great-grandmother   No family history of birth defects.  Soc Hx: Current smoker, Alcohol: none and Recreational drug use: former, marijuana  Objective:   Vitals:   07/04/18 0916  BP: 120/80   Constitutional: Well nourished, well developed female in  no acute distress.  HEENT: normal Skin: Warm and dry.  Cardiovascular: Regular rate and rhythm.   Extremity: trace to 1+ bilateral pedal edema Respiratory: Clear to auscultation bilaterally. Normal respiratory effort Abdomen: gravid, non-tender Neuro: Cranial nerves grossly intact Psych: Alert and Oriented x3. No memory deficits. Normal mood and affect.  MS: normal gait, normal bilateral lower extremity ROM/strength/stability.  Pelvic exam: deferred per patient request, last exam was 0/thick/-3 Uterus: Fundal height = 39 cm  Adnexa: not evaluated  FHR 130s-140s via Doppler   Perinatal info:  Blood type: O positive Rubella- Immune Varicella -Immune TDaP Given during third trimester of this pregnancy on 04/12/2018 RPR NR / HIV Neg/ HBsAg Neg   Assessment & Plan:   33 y.o. H7D4287 @ [redacted]w[redacted]d, Admitted for an elective induction of labor at term.    Cytotec unless cervix has changed on admission GBS status negative, treat as needed  Avel Sensor, [facility] 07/04/2018  10:41 AM

## 2018-07-04 NOTE — Progress Notes (Signed)
    Routine Prenatal Care Visit  Subjective  Dana Chen is a 33 y.o. G3P2002 at [redacted]w[redacted]d being seen today for ongoing prenatal care.  She is currently monitored for the following issues for this low-risk pregnancy and has Supervision of other normal pregnancy, antepartum; Tobacco smoking affecting pregnancy, antepartum; Marijuana use; Hemoglobin C trait ([place]); Anemia in pregnancy; and Low vitamin B12 level on their problem list.  ----------------------------------------------------------------------------------- Patient reports pressure, infrequent contractions.   Contractions: Irregular. Vag. Bleeding: None.  Movement: Present. No leaking of fluid.  ----------------------------------------------------------------------------------- The following portions of the patient's history were reviewed and updated as appropriate: allergies, current medications, past family history, past medical history, past social history, past surgical history and problem list. Problem list updated.   Objective  Blood pressure 120/80, weight 203 lb (92.1 kg), last menstrual period 10/01/2017. Pregravid weight 163 lb (73.9 kg) Total Weight Gain 40 lb (18.1 kg)  Fetal Status: Fetal Heart Rate (bpm): 143 Fundal Height: 39 cm Movement: Present     General:  Alert, oriented and cooperative. Patient is in no acute distress.  Skin: Skin is warm and dry. No rash noted.   Cardiovascular: Normal heart rate noted  Respiratory: Normal respiratory effort, no problems with respiration noted  Abdomen: Soft, gravid, appropriate for gestational age. Pain/Pressure: Present     Pelvic:  Cervical exam deferred        Extremities: Normal range of motion.  Edema: None  Mental Status: Normal mood and affect. Normal behavior. Normal judgment and thought content.     Assessment   33 y.o. [id number] at [redacted]w[redacted]d, EDD 07/08/2018 by Last Menstrual Period presenting for a routine prenatal visit.  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 by Malachy Mood, MD No   Supervision of other normal pregnancy, antepartum 02/03/2018 by Rexene Agent, CNM No   Overview Addendum 06/08/2018  9:41 AM by Dalia Heading, [place] Prenatal Labs  Dating LMP = 19 week [place] Blood type: O/Positive/-- (01/31 1445)   Genetic Screen NIPS: Normal XX Antibody:Negative (01/31 1445)  Anatomic [place] Normal Rubella: 2.50 (01/31 1445) Varicella: Immune  GTT 150, and normal 3 hour GTT RPR: Non Reactive (01/31 1445)   Rhogam N/A HBsAg: Negative (01/31 1445)   TDaP vaccine 04/12/2018  Flu Shot: HIV: Non Reactive (01/31 1445)   Baby Food           Breast                     GBS:   Contraception Pills Pap:  CBB   28 week H&H 8.5/25%. HAs hemoglobin C trait  CS/VBAC N/A   Support Person               Discussed induction of labor, and patient elects to have a labor induction scheduled.   Confirmed that drive up test site is open on Friday and order entered for pre-admit COVID 19 test.  Please refer to After Visit Summary for other counseling recommendations.   IOL scheduled 07/10/2018 at 0800.  Avel Sensor, CNM 07/04/2018  10:39 AM

## 2018-07-04 NOTE — Progress Notes (Signed)
rouin

## 2018-07-07 ENCOUNTER — Other Ambulatory Visit: Payer: Self-pay

## 2018-07-07 ENCOUNTER — Other Ambulatory Visit
Admission: RE | Admit: 2018-07-07 | Discharge: 2018-07-07 | Disposition: A | Discharge status: Home / Self Care | Payer: Medicaid Other | Source: Ambulatory Visit | Attending: Advanced Practice Midwife | Admitting: Advanced Practice Midwife

## 2018-07-07 DIAGNOSIS — Z01812 Encounter for preprocedural laboratory examination: Secondary | ICD-10-CM | POA: Insufficient documentation

## 2018-07-07 DIAGNOSIS — Z1159 Encounter for screening for other viral diseases: Secondary | ICD-10-CM | POA: Insufficient documentation

## 2018-07-07 LAB — SARS CORONAVIRUS 2 (TAT 6-24 HRS): SARS Coronavirus 2: NEGATIVE

## 2018-07-10 ENCOUNTER — Inpatient Hospital Stay
Admission: EM | Admit: 2018-07-10 | Discharge: 2018-07-13 | DRG: 806 | Disposition: A | Discharge status: Home / Self Care | Payer: Medicaid Other | Attending: Obstetrics & Gynecology | Admitting: Obstetrics & Gynecology

## 2018-07-10 ENCOUNTER — Other Ambulatory Visit: Payer: Self-pay

## 2018-07-10 DIAGNOSIS — O99324 Drug use complicating childbirth: Principal | ICD-10-CM | POA: Diagnosis present

## 2018-07-10 DIAGNOSIS — O8612 Endometritis following delivery: Secondary | ICD-10-CM | POA: Diagnosis not present

## 2018-07-10 DIAGNOSIS — D62 Acute posthemorrhagic anemia: Secondary | ICD-10-CM | POA: Diagnosis not present

## 2018-07-10 DIAGNOSIS — F1290 Cannabis use, unspecified, uncomplicated: Secondary | ICD-10-CM | POA: Diagnosis present

## 2018-07-10 DIAGNOSIS — Z3A40 40 weeks gestation of pregnancy: Secondary | ICD-10-CM

## 2018-07-10 DIAGNOSIS — O9081 Anemia of the puerperium: Secondary | ICD-10-CM | POA: Diagnosis not present

## 2018-07-10 DIAGNOSIS — Z3490 Encounter for supervision of normal pregnancy, unspecified, unspecified trimester: Secondary | ICD-10-CM | POA: Diagnosis present

## 2018-07-10 DIAGNOSIS — D582 Other hemoglobinopathies: Secondary | ICD-10-CM

## 2018-07-10 DIAGNOSIS — O26893 Other specified pregnancy related conditions, third trimester: Secondary | ICD-10-CM | POA: Diagnosis present

## 2018-07-10 DIAGNOSIS — O480 Post-term pregnancy: Secondary | ICD-10-CM

## 2018-07-10 DIAGNOSIS — Z3480 Encounter for supervision of other normal pregnancy, unspecified trimester: Secondary | ICD-10-CM

## 2018-07-10 DIAGNOSIS — O9902 Anemia complicating childbirth: Secondary | ICD-10-CM | POA: Diagnosis not present

## 2018-07-10 LAB — CBC
HCT: 25.1 % — ABNORMAL LOW (ref 36.0–46.0)
Hemoglobin: 8.7 g/dL — ABNORMAL LOW (ref 12.0–15.0)
MCH: 30.2 pg (ref 26.0–34.0)
MCHC: 34.7 g/dL (ref 30.0–36.0)
MCV: 87.2 fL (ref 80.0–100.0)
Platelets: 278 10*3/uL (ref 150–400)
RBC: 2.88 MIL/uL — ABNORMAL LOW (ref 3.87–5.11)
RDW: 14.6 % (ref 11.5–15.5)
WBC: 9.2 10*3/uL (ref 4.0–10.5)
nRBC: 0 % (ref 0.0–0.2)

## 2018-07-10 LAB — URINE DRUG SCREEN, QUALITATIVE (ARMC ONLY)
Amphetamines, Ur Screen: NOT DETECTED
Barbiturates, Ur Screen: NOT DETECTED
Benzodiazepine, Ur Scrn: NOT DETECTED
Cannabinoid 50 Ng, Ur ~~LOC~~: POSITIVE — AB
Cocaine Metabolite,Ur ~~LOC~~: NOT DETECTED
MDMA (Ecstasy)Ur Screen: NOT DETECTED
Methadone Scn, Ur: NOT DETECTED
Opiate, Ur Screen: NOT DETECTED
Phencyclidine (PCP) Ur S: NOT DETECTED
Tricyclic, Ur Screen: NOT DETECTED

## 2018-07-10 LAB — TYPE AND SCREEN
ABO/RH(D): O POS
Antibody Screen: NEGATIVE

## 2018-07-10 MED ORDER — LACTATED RINGERS IV SOLN: 500.0000 mL | INTRAVENOUS | Status: DC | PRN

## 2018-07-10 MED ORDER — MISOPROSTOL 25 MCG QUARTER TABLET
25.0000 ug | ORAL_TABLET | ORAL | Status: AC
  Administered 2018-07-10: 25 ug via ORAL

## 2018-07-10 MED ORDER — LACTATED RINGERS IV SOLN
INTRAVENOUS | Status: DC
  Administered 2018-07-10 – 2018-07-11 (×5): via INTRAVENOUS

## 2018-07-10 MED ORDER — TERBUTALINE SULFATE 1 MG/ML IJ SOLN: 0.2500 mg | Freq: Once | INTRAMUSCULAR | Status: DC | PRN

## 2018-07-10 MED ORDER — MISOPROSTOL 25 MCG QUARTER TABLET
25.0000 ug | ORAL_TABLET | ORAL | Status: DC | PRN
  Administered 2018-07-10 (×2): 25 ug via VAGINAL
  Filled 2018-07-10 (×3): qty 1

## 2018-07-10 MED ORDER — CYANOCOBALAMIN 1000 MCG/ML IJ SOLN
1000.0000 ug | Freq: Once | INTRAMUSCULAR | Status: AC
  Administered 2018-07-10: 1000 ug via INTRAMUSCULAR
  Filled 2018-07-10: qty 1

## 2018-07-10 MED ORDER — ACETAMINOPHEN 325 MG PO TABS: 650.0000 mg | ORAL_TABLET | ORAL | Status: DC | PRN

## 2018-07-10 MED ORDER — OXYTOCIN 40 UNITS IN NORMAL SALINE INFUSION - SIMPLE MED
1.0000 m[IU]/min | INTRAVENOUS | Status: DC
  Administered 2018-07-11 (×2): 1 m[IU]/min via INTRAVENOUS
  Filled 2018-07-10: qty 1000

## 2018-07-10 MED ORDER — OXYTOCIN BOLUS FROM INFUSION
500.0000 mL | Freq: Once | INTRAVENOUS | Status: AC
  Administered 2018-07-11: 500 mL via INTRAVENOUS

## 2018-07-10 MED ORDER — BUTORPHANOL TARTRATE 2 MG/ML IJ SOLN
1.0000 mg | INTRAMUSCULAR | Status: DC | PRN
  Administered 2018-07-10 – 2018-07-11 (×3): 1 mg via INTRAVENOUS
  Filled 2018-07-10 (×3): qty 1

## 2018-07-10 MED ORDER — MISOPROSTOL 25 MCG QUARTER TABLET
25.0000 ug | ORAL_TABLET | ORAL | Status: DC | PRN
  Administered 2018-07-10: 25 ug via VAGINAL
  Filled 2018-07-10: qty 1

## 2018-07-10 MED ORDER — OXYTOCIN 40 UNITS IN NORMAL SALINE INFUSION - SIMPLE MED: 2.5000 [IU]/h | INTRAVENOUS | Status: DC

## 2018-07-10 MED ORDER — LIDOCAINE HCL (PF) 1 % IJ SOLN: 30.0000 mL | INTRAMUSCULAR | Status: DC | PRN

## 2018-07-10 MED ORDER — ONDANSETRON HCL 4 MG/2ML IJ SOLN
4.0000 mg | Freq: Four times a day (QID) | INTRAMUSCULAR | Status: DC | PRN
  Administered 2018-07-11: 4 mg via INTRAVENOUS
  Filled 2018-07-10: qty 2

## 2018-07-10 NOTE — Progress Notes (Signed)
Date of Initial H&P: 07/04/2018  History reviewed, patient examined, changes noted below:  33 year old G3 P2002 with EDC=07/08/2018, presents at [facility] for an elective IOL. Prenatal care at: at [facility]. Pregnancy complicated by anemia (low vitamin B12, Hemoglobin C trait), tobacco use, marijuana use in early pregnancy. Patient reports some lower back pain, mild occasional contraction. Denies leakage of fluid or bleeding Pelvis proven to 9#2oz. Prenatal care also remarkable for the following:  Clinic [facility] Prenatal Labs  Dating LMP = 19 week [place] Blood type: O/Positive/-- (01/31 1445)   Genetic Screen NIPS: Normal XX Antibody:Negative (01/31 1445)  Anatomic [place] Normal Rubella: 2.50 (01/31 1445) Varicella: Immune  GTT 150, and normal 3 hour GTT RPR: Non Reactive (01/31 1445)   Rhogam N/A HBsAg: Negative (01/31 1445)   TDaP vaccine 04/12/2018  Flu Shot: HIV: Non Reactive (01/31 1445)   Baby Food           Breast                     GBS: negative  Contraception Pills Pap:  CBB   28 week H&H 8.5/25%. HAs hemoglobin C trait  CS/VBAC N/A   Support Person         EXAM: General: calm, in NAD Vital signs: BP 131/72 (BP Location: Left Arm)   Pulse 86   Temp 98 F (36.7 C) (Oral)   Resp 16   Ht 5\' 5"  (1.651 m)   Wt 91.6 kg   LMP 10/01/2017   BMI 33.61 kg/m   FHR: 135-140 with accelerations to 150, moderate variability TOCO: occasional contraction SVE: closed/long/med/mid/-3  ULtrasound confirmed cephalic presentation (LOT/LOP)  A: IUP at 40.2 weeks for elective IOL Reassuring FHR tracing Bishop score 2  P: Discussed unripe cervix and need for serial induction which will probably take several days. Discussed methods of induction and risks of hyperstimulation,  failed induction, fetal intolerance, and Cesarean section.  Alternative to induction today is discharge home and await spontaneous labor or return at 41 weeks for induction. Patient wishes to proceed with induction. Given Cytotec 25  mcg orally and 25 mcg PV to start induction.  O POS/ VI/ RI Breast Pills GBS negative TDAP 04/12/2018  Dalia Heading, CNM

## 2018-07-11 ENCOUNTER — Inpatient Hospital Stay: Payer: Medicaid Other | Admitting: Registered Nurse

## 2018-07-11 DIAGNOSIS — O9902 Anemia complicating childbirth: Secondary | ICD-10-CM

## 2018-07-11 LAB — RPR: RPR Ser Ql: NONREACTIVE

## 2018-07-11 MED ORDER — FENTANYL 2.5 MCG/ML W/ROPIVACAINE 0.15% IN NS 100 ML EPIDURAL (ARMC)
EPIDURAL | Status: AC
  Filled 2018-07-11: qty 100

## 2018-07-11 MED ORDER — ACETAMINOPHEN 325 MG PO TABS
650.0000 mg | ORAL_TABLET | ORAL | Status: DC | PRN
  Administered 2018-07-12 – 2018-07-13 (×5): 650 mg via ORAL
  Filled 2018-07-11 (×5): qty 2

## 2018-07-11 MED ORDER — TERBUTALINE SULFATE 1 MG/ML IJ SOLN: 0.2500 mg | Freq: Once | INTRAMUSCULAR | Status: DC | PRN

## 2018-07-11 MED ORDER — LACTATED RINGERS IV SOLN
500.0000 mL | Freq: Once | INTRAVENOUS | Status: AC
  Administered 2018-07-11: 500 mL via INTRAVENOUS

## 2018-07-11 MED ORDER — OXYTOCIN 10 UNIT/ML IJ SOLN
INTRAMUSCULAR | Status: AC
  Filled 2018-07-11: qty 2

## 2018-07-11 MED ORDER — LIDOCAINE-EPINEPHRINE (PF) 1.5 %-1:200000 IJ SOLN
INTRAMUSCULAR | Status: DC | PRN
  Administered 2018-07-11: 3 mL via PERINEURAL

## 2018-07-11 MED ORDER — DIPHENHYDRAMINE HCL 50 MG/ML IJ SOLN: 12.5000 mg | INTRAMUSCULAR | Status: DC | PRN

## 2018-07-11 MED ORDER — LIDOCAINE HCL (PF) 1 % IJ SOLN
INTRAMUSCULAR | Status: DC | PRN
  Administered 2018-07-11: 3 mL

## 2018-07-11 MED ORDER — AMMONIA AROMATIC IN INHA
RESPIRATORY_TRACT | Status: AC
  Filled 2018-07-11: qty 10

## 2018-07-11 MED ORDER — IBUPROFEN 600 MG PO TABS
600.0000 mg | ORAL_TABLET | Freq: Four times a day (QID) | ORAL | Status: DC
  Administered 2018-07-12 – 2018-07-13 (×6): 600 mg via ORAL
  Filled 2018-07-11 (×6): qty 1

## 2018-07-11 MED ORDER — DIPHENHYDRAMINE HCL 25 MG PO CAPS: 25.0000 mg | ORAL_CAPSULE | Freq: Four times a day (QID) | ORAL | Status: DC | PRN

## 2018-07-11 MED ORDER — ONDANSETRON HCL 4 MG PO TABS: 4.0000 mg | ORAL_TABLET | ORAL | Status: DC | PRN

## 2018-07-11 MED ORDER — CLINDAMYCIN PHOSPHATE 900 MG/50ML IV SOLN
900.0000 mg | Freq: Once | INTRAVENOUS | Status: AC
  Administered 2018-07-11: 900 mg via INTRAVENOUS
  Filled 2018-07-11: qty 50

## 2018-07-11 MED ORDER — MISOPROSTOL 200 MCG PO TABS
ORAL_TABLET | ORAL | Status: AC
  Filled 2018-07-11: qty 4

## 2018-07-11 MED ORDER — PHENYLEPHRINE 40 MCG/ML (10ML) SYRINGE FOR IV PUSH (FOR BLOOD PRESSURE SUPPORT): 80.0000 ug | PREFILLED_SYRINGE | INTRAVENOUS | Status: DC | PRN

## 2018-07-11 MED ORDER — FENTANYL 2.5 MCG/ML W/ROPIVACAINE 0.15% IN NS 100 ML EPIDURAL (ARMC)
12.0000 mL/h | EPIDURAL | Status: DC
  Administered 2018-07-11 (×2): 12 mL/h via EPIDURAL
  Filled 2018-07-11: qty 100

## 2018-07-11 MED ORDER — OXYTOCIN 40 UNITS IN NORMAL SALINE INFUSION - SIMPLE MED: 1.0000 m[IU]/min | INTRAVENOUS | Status: DC

## 2018-07-11 MED ORDER — OXYTOCIN 40 UNITS IN NORMAL SALINE INFUSION - SIMPLE MED
1.0000 m[IU]/min | INTRAVENOUS | Status: DC
  Administered 2018-07-11: 8 m[IU]/min via INTRAVENOUS
  Administered 2018-07-11: 4 m[IU]/min via INTRAVENOUS

## 2018-07-11 MED ORDER — EPHEDRINE 5 MG/ML INJ: 10.0000 mg | INTRAVENOUS | Status: DC | PRN

## 2018-07-11 MED ORDER — DOCUSATE SODIUM 100 MG PO CAPS
100.0000 mg | ORAL_CAPSULE | Freq: Two times a day (BID) | ORAL | Status: DC
  Administered 2018-07-12 – 2018-07-13 (×3): 100 mg via ORAL
  Filled 2018-07-11 (×3): qty 1

## 2018-07-11 MED ORDER — PRENATAL MULTIVITAMIN CH
1.0000 | ORAL_TABLET | Freq: Every day | ORAL | Status: DC
  Administered 2018-07-12 – 2018-07-13 (×2): 1 via ORAL
  Filled 2018-07-11 (×2): qty 1

## 2018-07-11 MED ORDER — SUCRALFATE 1 GM/10ML PO SUSP
1.0000 g | Freq: Three times a day (TID) | ORAL | Status: DC
  Administered 2018-07-11: 1 g via ORAL
  Filled 2018-07-11: qty 10

## 2018-07-11 MED ORDER — ONDANSETRON HCL 4 MG/2ML IJ SOLN: 4.0000 mg | INTRAMUSCULAR | Status: DC | PRN

## 2018-07-11 MED ORDER — METHYLERGONOVINE MALEATE 0.2 MG PO TABS
0.2000 mg | ORAL_TABLET | ORAL | Status: DC | PRN
  Filled 2018-07-11: qty 1

## 2018-07-11 MED ORDER — MISOPROSTOL 200 MCG PO TABS
800.0000 ug | ORAL_TABLET | Freq: Once | ORAL | Status: AC
  Administered 2018-07-11: 800 ug via RECTAL

## 2018-07-11 MED ORDER — METHYLERGONOVINE MALEATE 0.2 MG/ML IJ SOLN: 0.2000 mg | INTRAMUSCULAR | Status: DC | PRN

## 2018-07-11 MED ORDER — BUPIVACAINE HCL (PF) 0.25 % IJ SOLN
INTRAMUSCULAR | Status: DC | PRN
  Administered 2018-07-11 (×2): 4 mL via EPIDURAL

## 2018-07-11 MED ORDER — FERROUS SULFATE 325 (65 FE) MG PO TABS
325.0000 mg | ORAL_TABLET | Freq: Every day | ORAL | Status: DC
  Administered 2018-07-12 – 2018-07-13 (×2): 325 mg via ORAL
  Filled 2018-07-11 (×2): qty 1

## 2018-07-11 MED ORDER — LIDOCAINE HCL (PF) 1 % IJ SOLN
INTRAMUSCULAR | Status: AC
  Filled 2018-07-11: qty 30

## 2018-07-11 MED ORDER — GENTAMICIN SULFATE 40 MG/ML IJ SOLN
1.5000 mg/kg | Freq: Once | INTRAVENOUS | Status: AC
  Administered 2018-07-11: 140 mg via INTRAVENOUS
  Filled 2018-07-11: qty 3.5

## 2018-07-11 NOTE — Discharge Summary (Signed)
OB Discharge Summary     Patient Name: Dana Chen DOB: Jul 12, 1985 MRN: [id number]  Date of admission: 07/10/2018 Delivering MD: Natale Milchhristanna R Schuman, MD  Date of Delivery: 07/11/2018  Date of discharge: 07/13/2018  Admitting diagnosis: 40 weeks  induction Intrauterine pregnancy: [redacted]w[redacted]d     Secondary diagnosis: None     Discharge diagnosis: Term Pregnancy Delivered, Endometritis                         Hospital course:  Induction of Labor With Vaginal Delivery   33 y.o. yo [id number] at 898w3d was admitted to the hospital 07/10/2018 for induction of labor.  Indication for induction: Favorable cervix at term.  Patient had an uncomplicated labor course as follows: Membrane Rupture Time/Date: 5:00 PM ,07/11/2018   Intrapartum Procedures: Episiotomy: None [1]                                         Lacerations:  2nd degree [3]  Patient had delivery of a Viable infant.  Information for the patient's newborn:  Huel CoteHawkins, Girl Thedora [[id number]][[id number]]  Delivery Method: Vag-Spont    07/11/2018  Details of delivery can be found in separate delivery note. Patient received antibiotics postpartum for elevated WBC/endometritis. Otherwise, patient had a routine postpartum course. Patient is discharged home 07/13/18 in stable condition.                                                                 Post partum procedures:none  Complications: None  Physical exam on 07/13/2018: Vitals:   07/12/18 1116 07/12/18 1636 07/12/18 2317 07/13/18 0828  BP: 113/75 104/68 117/86 121/87  Pulse: 72 64 70 70  Resp: 18 18 18 18   Temp: 98.2 F (36.8 C) 98.4 F (36.9 C) 98.2 F (36.8 C) 98.6 F (37 C)  TempSrc: Oral Oral Oral Oral  SpO2: 100%  99% 99%  Weight:      Height:       General: alert, cooperative and no distress Lochia: appropriate Uterine Fundus: firm Incision: N/A DVT Evaluation: No evidence of DVT seen on physical exam.  Labs: Lab Results  Component Value Date   WBC 20.6 (H) 07/13/2018   HGB 7.4 (L)  07/13/2018   HCT 21.0 (L) 07/13/2018   MCV 85.4 07/13/2018   PLT 223 07/13/2018   CMP Latest Ref Rng & Units 06/16/2018  Glucose 70 - 99 mg/dL 94  BUN 6 - 20 mg/dL 7  Creatinine 2.530.44 - 6.641.00 mg/dL 4.030.63  Sodium 474135 - 259145 mmol/L 137  Potassium 3.5 - 5.1 mmol/L 3.9  Chloride 98 - 111 mmol/L 110  CO2 22 - 32 mmol/L 20(L)  Calcium 8.9 - 10.3 mg/dL 9.1    Discharge instruction: per After Visit Summary.  Medications:  Allergies as of 07/13/2018      Reactions   Peanut Oil Hives   Strawberry Extract Anaphylaxis      Medication List    STOP taking these medications   ondansetron 4 MG disintegrating tablet Commonly known as: ZOFRAN-ODT     TAKE these medications   ferrous sulfate 325 (65 FE) MG tablet Commonly known as: FerrouSul Take  1 tablet (325 mg total) by mouth daily with breakfast.   Prenatal Vitamins 28-0.8 MG Tabs Take 1 tablet by mouth daily.   vitamin B-12 1000 MCG tablet Commonly known as: CYANOCOBALAMIN Take 1 tablet (1,000 mcg total) by mouth daily.       Diet: routine diet  Activity: Advance as tolerated. Pelvic rest for 6 weeks.   Outpatient follow up: Follow-up Information    Schuman, Christanna R, MD Follow up in 6 week(s).   Specialty: Obstetrics and Gynecology Contact information: [address] [place] [address] [phone]             Postpartum contraception: Still deciding, possibly combination OCPs Rhogam Given postpartum: no Rubella vaccine given postpartum: no Varicella vaccine given postpartum: no TDaP given antepartum or postpartum: No  Newborn Data: Live born female  Birth Weight:   APGAR: 64, 9  Newborn Delivery   Birth date/time: 07/11/2018 22:24:00 Delivery type: Vaginal, Spontaneous       Baby Feeding: Breast and formula  Disposition:home with mother  SIGNED: Avel Sensor, CNM 07/13/2018  10:31 AM

## 2018-07-11 NOTE — Anesthesia Preprocedure Evaluation (Signed)
Anesthesia Evaluation  Patient identified by MRN, date of birth, ID band Patient awake    Reviewed: Allergy & Precautions, H&P , NPO status , Patient's Chart, lab work & pertinent test results, reviewed documented beta blocker date and time   Airway Mallampati: II  TM Distance: >3 FB Neck ROM: full    Dental no notable dental hx. (+) Teeth Intact   Pulmonary neg pulmonary ROS, Current Smoker,    Pulmonary exam normal breath sounds clear to auscultation       Cardiovascular Exercise Tolerance: Good negative cardio ROS   Rhythm:regular Rate:Normal     Neuro/Psych negative neurological ROS  negative psych ROS   GI/Hepatic negative GI ROS, Neg liver ROS,   Endo/Other  negative endocrine ROSdiabetes  Renal/GU      Musculoskeletal   Abdominal   Peds  Hematology  (+) Blood dyscrasia, anemia ,   Anesthesia Other Findings   Reproductive/Obstetrics (+) Pregnancy                             Anesthesia Physical Anesthesia Plan  ASA: II  Anesthesia Plan: Epidural   Post-op Pain Management:    Induction:   PONV Risk Score and Plan:   Airway Management Planned:   Additional Equipment:   Intra-op Plan:   Post-operative Plan:   Informed Consent: I have reviewed the patients History and Physical, chart, labs and discussed the procedure including the risks, benefits and alternatives for the proposed anesthesia with the patient or authorized representative who has indicated his/her understanding and acceptance.       Plan Discussed with:   Anesthesia Plan Comments:         Anesthesia Quick Evaluation

## 2018-07-11 NOTE — Anesthesia Procedure Notes (Signed)
Epidural Patient location during procedure: OB Start time: 07/11/2018 3:38 PM End time: 07/11/2018 3:43 PM  Staffing Anesthesiologist: Molli Barrows, MD Resident/CRNA: Doreen Salvage, CRNA Performed: resident/CRNA   Preanesthetic Checklist Completed: patient identified, site marked, surgical consent, pre-op evaluation, timeout performed, IV checked, risks and benefits discussed and monitors and equipment checked  Epidural Patient position: sitting Prep: ChloraPrep Patient monitoring: heart rate, continuous pulse ox and blood pressure Approach: midline Location: L3-L4 Injection technique: LOR saline  Needle:  Needle type: Tuohy  Needle gauge: 17 G Needle length: 9 cm and 9 Needle insertion depth: 6 cm Catheter type: closed end flexible Catheter size: 19 Gauge Catheter at skin depth: 11 cm Test dose: negative and 1.5% lidocaine with Epi 1:200 K  Assessment Sensory level: T10 Events: blood not aspirated, injection not painful, no injection resistance, negative IV test and no paresthesia  Additional Notes 1attempt Pt. Evaluated and documentation done after procedure finished. Patient identified. Risks/Benefits/Options discussed with patient including but not limited to bleeding, infection, nerve damage, paralysis, failed block, incomplete pain control, headache, blood pressure changes, nausea, vomiting, reactions to medication both or allergic, itching and postpartum back pain. Confirmed with bedside nurse the patient's most recent platelet count. Confirmed with patient that they are not currently taking any anticoagulation, have any bleeding history or any family history of bleeding disorders. Patient expressed understanding and wished to proceed. All questions were answered. Sterile technique was used throughout the entire procedure. Please see nursing notes for vital signs. Test dose was given through epidural catheter and negative prior to continuing to dose epidural or start  infusion. Warning signs of high block given to the patient including shortness of breath, tingling/numbness in hands, complete motor block, or any concerning symptoms with instructions to call for help. Patient was given instructions on fall risk and not to get out of bed. All questions and concerns addressed with instructions to call with any issues or inadequate analgesia.   Patient tolerated the insertion well without immediate complications.Reason for block:procedure for pain

## 2018-07-11 NOTE — Progress Notes (Signed)
Patient having painful contractions. She is frustrated with the IOL process and not advancing quicker.    SVE 1/90/-3 Placed a foley bulb easily.  Will discontinue pitocin. Patient will eat a meal. Will resume IOL after lunch.  Discussed plan with patient who is in agreement.   Adrian Prows MD [facility], [facility] 07/11/2018 1:06 PM

## 2018-07-11 NOTE — Progress Notes (Signed)
L&D progress Note  33 yo admitted yesterday for elective IOL at 40.2 weeks. Received 3 doses of Cytotec and was contracting frequently, but did not experience painful contractions until after 8PM last night. Stadol relieved pain. Cervix remained closed yesterday.    S: Woke up with contractions around 0300 and received more Stadol. Slept for a little bit then got up around 0400 and showered. Rating contractions 8/10.   O: BP 117/60 (BP Location: Left Arm)   Pulse 74   Temp 98.2 F (36.8 C) (Oral)   Resp 16   Ht 5\' 5"  (1.651 m)   Wt 91.6 kg   LMP 10/01/2017   BMI 33.61 kg/m   General: grimacing through some contractions.  Abdomen: contractions moderate to palpation/ cephalic presentation FHR: 130 baseline with accelerations to 150s, moderate variability Toco: contractions every 3-5 minutes apart Cervic: closed/ ?60-70%/-2 (there is descent with contractions)  Results for orders placed or performed during the hospital encounter of 07/10/18 (from the past 24 hour(s))  CBC     Status: Abnormal   Collection Time: 07/10/18 10:18 AM  Result Value Ref Range   WBC 9.2 4.0 - 10.5 K/uL   RBC 2.88 (L) 3.87 - 5.11 MIL/uL   Hemoglobin 8.7 (L) 12.0 - 15.0 g/dL   HCT 25.1 (L) 36.0 - 46.0 %   MCV 87.2 80.0 - 100.0 fL   MCH 30.2 26.0 - 34.0 pg   MCHC 34.7 30.0 - 36.0 g/dL   RDW 14.6 11.5 - 15.5 %   Platelets 278 150 - 400 K/uL   nRBC 0.0 0.0 - 0.2 %  RPR     Status: None   Collection Time: 07/10/18 10:18 AM  Result Value Ref Range   RPR Ser Ql Non Reactive Non Reactive  Type and screen     Status: None   Collection Time: 07/10/18 10:18 AM  Result Value Ref Range   ABO/RH(D) O POS    Antibody Screen NEG    Sample Expiration      07/13/2018,2359 Performed at [facility], [address] [place], [place] [address]   Urine Drug Screen, Qualitative ([place] only)     Status: Abnormal   Collection Time: 07/10/18 11:24 AM  Result Value Ref Range   Tricyclic, Ur Screen NONE  DETECTED NONE DETECTED   Amphetamines, Ur Screen NONE DETECTED NONE DETECTED   MDMA (Ecstasy)Ur Screen NONE DETECTED NONE DETECTED   Cocaine Metabolite,Ur [place] NONE DETECTED NONE DETECTED   Opiate, Ur Screen NONE DETECTED NONE DETECTED   Phencyclidine (PCP) Ur S NONE DETECTED NONE DETECTED   Cannabinoid 50 Ng, Ur [place] POSITIVE (A) NONE DETECTED   Barbiturates, Ur Screen NONE DETECTED NONE DETECTED   Benzodiazepine, Ur Scrn NONE DETECTED NONE DETECTED   Methadone Scn, Ur NONE DETECTED NONE DETECTED   A: day 2 of elective induction Cat 1 tracing Anemia +UDS for MJ  P: Pitocin induction/augmentation started this AM. Stadol for pain Epidural when making progress.  Dalia Heading, CNM

## 2018-07-12 LAB — CBC
HCT: 24.4 % — ABNORMAL LOW (ref 36.0–46.0)
Hemoglobin: 8.7 g/dL — ABNORMAL LOW (ref 12.0–15.0)
MCH: 30.5 pg (ref 26.0–34.0)
MCHC: 35.7 g/dL (ref 30.0–36.0)
MCV: 85.6 fL (ref 80.0–100.0)
Platelets: 259 10*3/uL (ref 150–400)
RBC: 2.85 MIL/uL — ABNORMAL LOW (ref 3.87–5.11)
RDW: 14.3 % (ref 11.5–15.5)
WBC: 30.9 10*3/uL — ABNORMAL HIGH (ref 4.0–10.5)
nRBC: 0 % (ref 0.0–0.2)

## 2018-07-12 MED ORDER — WITCH HAZEL-GLYCERIN EX PADS
1.0000 "application " | MEDICATED_PAD | CUTANEOUS | Status: DC | PRN
  Filled 2018-07-12: qty 100

## 2018-07-12 MED ORDER — ZOLPIDEM TARTRATE 5 MG PO TABS: 5.0000 mg | ORAL_TABLET | Freq: Every evening | ORAL | Status: DC | PRN

## 2018-07-12 MED ORDER — DIBUCAINE (PERIANAL) 1 % EX OINT: 1.0000 "application " | TOPICAL_OINTMENT | CUTANEOUS | Status: DC | PRN

## 2018-07-12 MED ORDER — CLINDAMYCIN PHOSPHATE 900 MG/50ML IV SOLN
900.0000 mg | Freq: Three times a day (TID) | INTRAVENOUS | Status: DC
  Administered 2018-07-12 – 2018-07-13 (×4): 900 mg via INTRAVENOUS
  Filled 2018-07-12 (×6): qty 50

## 2018-07-12 MED ORDER — MAGNESIUM HYDROXIDE 400 MG/5ML PO SUSP: 30.0000 mL | ORAL | Status: DC | PRN

## 2018-07-12 MED ORDER — BENZOCAINE-MENTHOL 20-0.5 % EX AERO
1.0000 "application " | INHALATION_SPRAY | CUTANEOUS | Status: DC | PRN
  Filled 2018-07-12: qty 56

## 2018-07-12 MED ORDER — SODIUM CHLORIDE 0.9 % IV SOLN
INTRAVENOUS | Status: DC | PRN
  Administered 2018-07-12 (×2): via INTRAVENOUS

## 2018-07-12 MED ORDER — SIMETHICONE 80 MG PO CHEW: 80.0000 mg | CHEWABLE_TABLET | ORAL | Status: DC | PRN

## 2018-07-12 MED ORDER — GENTAMICIN SULFATE 40 MG/ML IJ SOLN
1.5000 mg/kg | Freq: Three times a day (TID) | INTRAVENOUS | Status: DC
  Administered 2018-07-12 – 2018-07-13 (×4): 140 mg via INTRAVENOUS
  Filled 2018-07-12 (×5): qty 3.5

## 2018-07-12 MED ORDER — COCONUT OIL OIL
1.0000 "application " | TOPICAL_OIL | Status: DC | PRN
  Filled 2018-07-12: qty 120

## 2018-07-12 NOTE — Progress Notes (Addendum)
Subjective:  Patient is doing well postpartum Day 1. She is tolerating regular diet. Her pain is controlled with PO medications. She is ambulating and voiding without difficulty. She has had a bowel movement. She doesn't think baby is satisfied with breastfeeding so she is giving formula after breastfeeding. Patient is encouraged to breastfeed per baby's cues.   Objective:  Vital signs in last 24 hours: Temp:  [98.2 F (36.8 C)-99.7 F (37.6 C)] 98.2 F (36.8 C) (07/08 0723) Pulse Rate:  [76-119] 76 (07/08 0723) Resp:  [16-20] 18 (07/08 0723) BP: (94-129)/(50-76) 94/57 (07/08 0723) SpO2:  [95 %-100 %] 99 % (07/08 0723)    General: NAD Pulmonary: no increased work of breathing Abdomen: non-distended, non-tender, fundus firm at level of umbilicus Extremities: no edema, no erythema, no tenderness  Results for orders placed or performed during the hospital encounter of 07/10/18 (from the past 72 hour(s))  CBC     Status: Abnormal   Collection Time: 07/10/18 10:18 AM  Result Value Ref Range   WBC 9.2 4.0 - 10.5 K/uL   RBC 2.88 (L) 3.87 - 5.11 MIL/uL   Hemoglobin 8.7 (L) 12.0 - 15.0 g/dL   HCT 66.425.1 (L) 40.336.0 - 47.446.0 %   MCV 87.2 80.0 - 100.0 fL   MCH 30.2 26.0 - 34.0 pg   MCHC 34.7 30.0 - 36.0 g/dL   RDW 25.914.6 56.311.5 - 87.515.5 %   Platelets 278 150 - 400 K/uL   nRBC 0.0 0.0 - 0.2 %    Comment: Performed at [facility], [address] [place], [place] [address]  RPR     Status: None   Collection Time: 07/10/18 10:18 AM  Result Value Ref Range   RPR Ser Ql Non Reactive Non Reactive    Comment: (NOTE) Performed At: [facility] [address] [place], [place] [id number] Jolene SchimkeNagendra Sanjai MD AY:[id number]:[phone]   Type and screen     Status: None   Collection Time: 07/10/18 10:18 AM  Result Value Ref Range   ABO/RH(D) O POS    Antibody Screen NEG    Sample Expiration      07/13/2018,2359 Performed at [facility], [address] [place], [place] [address]    Urine Drug Screen, Qualitative ([facility] only)     Status: Abnormal   Collection Time: 07/10/18 11:24 AM  Result Value Ref Range   Tricyclic, Ur Screen NONE DETECTED NONE DETECTED   Amphetamines, Ur Screen NONE DETECTED NONE DETECTED   MDMA (Ecstasy)Ur Screen NONE DETECTED NONE DETECTED   Cocaine Metabolite,Ur [place] NONE DETECTED NONE DETECTED   Opiate, Ur Screen NONE DETECTED NONE DETECTED   Phencyclidine (PCP) Ur S NONE DETECTED NONE DETECTED   Cannabinoid 50 Ng, Ur [place] POSITIVE (A) NONE DETECTED   Barbiturates, Ur Screen NONE DETECTED NONE DETECTED   Benzodiazepine, Ur Scrn NONE DETECTED NONE DETECTED   Methadone Scn, Ur NONE DETECTED NONE DETECTED    Comment: (NOTE) Tricyclics + metabolites, urine    Cutoff 1000 ng/mL Amphetamines + metabolites, urine  Cutoff 1000 ng/mL MDMA (Ecstasy), urine              Cutoff 500 ng/mL Cocaine Metabolite, urine          Cutoff 300 ng/mL Opiate + metabolites, urine        Cutoff 300 ng/mL Phencyclidine (PCP), urine         Cutoff 25 ng/mL Cannabinoid, urine  Cutoff 50 ng/mL Barbiturates + metabolites, urine  Cutoff 200 ng/mL Benzodiazepine, urine              Cutoff 200 ng/mL Methadone, urine                   Cutoff 300 ng/mL The urine drug screen provides only a preliminary, unconfirmed analytical test result and should not be used for non-medical purposes. Clinical consideration and professional judgment should be applied to any positive drug screen result due to possible interfering substances. A more specific alternate chemical method must be used in order to obtain a confirmed analytical result. Gas chromatography / mass spectrometry (GC/MS) is the preferred confirmat ory method. Performed at [facility], [address] [place], [place] [address]   CBC     Status: Abnormal   Collection Time: 07/12/18  6:10 AM  Result Value Ref Range   WBC 30.9 (H) 4.0 - 10.5 K/uL   RBC 2.85 (L) 3.87 - 5.11 MIL/uL   Hemoglobin  8.7 (L) 12.0 - 15.0 g/dL   HCT 24.4 (L) 36.0 - 46.0 %   MCV 85.6 80.0 - 100.0 fL   MCH 30.5 26.0 - 34.0 pg   MCHC 35.7 30.0 - 36.0 g/dL   RDW 14.3 11.5 - 15.5 %   Platelets 259 150 - 400 K/uL   nRBC 0.0 0.0 - 0.2 %    Comment: Performed at [facility], [address] [place], [place] [address]    Assessment:   33 y.o. [phone] postpartum day # 1  Plan:    1) Acute blood loss anemia - hemodynamically stable and asymptomatic - po ferrous sulfate  2) O positive, rubella immune, varicella immune  3) TDAP status given antepartum  4) Feeding plan breast and formula  5)  Education given regarding options for contraception, as well as compatibility with breast feeding if applicable.  Patient is considering pills for contraception.  6) Elevated WBC: 24 hours clindamycin and gentamicin- repeat CBC tomorrow morning  7) Disposition: likely discharge to home tomorrow   Rod Can, [employer] 07/12/2018, 9:38 AM

## 2018-07-12 NOTE — Lactation Note (Signed)
This note was copied from a baby's chart. Lactation Consultation Note  Patient Name: Dana Chen Today's Date: 07/12/2018     Maternal Data    Feeding    LATCH Score                   Interventions    Lactation Tools Discussed/Used     Consult Status  LC spoke with patient about the importance of establishing good breastfeeding in the hospital setting. MOB mentioned that she was f/u with 79mL of infant formula to ensure baby is getting enough after nursing sessions. MOB states that she does hear swallow when baby is actively nursing, so LC suggested that MOB continue to follow baby's feeding cues and just breastfeed baby.   LC also reviewed clusterfeeding, transitional stools, the physiology of breastfeeding and how to know if baby is full and getting enough at the breast.       Marnee Spring 07/12/2018, 8:41 PM

## 2018-07-12 NOTE — Anesthesia Postprocedure Evaluation (Signed)
Anesthesia Post Note  Patient: Dana Chen  Procedure(s) Performed: AN AD [place]  Patient location during evaluation: Mother Baby Anesthesia Type: Epidural Level of consciousness: awake and alert Pain management: pain level controlled Vital Signs Assessment: post-procedure vital signs reviewed and stable Respiratory status: spontaneous breathing, nonlabored ventilation and respiratory function stable Cardiovascular status: stable Postop Assessment: no headache, no backache and epidural receding Anesthetic complications: no     Last Vitals:  Vitals:   07/12/18 0234 07/12/18 0434  BP: 104/60 113/68  Pulse: 86 80  Resp: 20   Temp: 37 C 36.8 C  SpO2: 96% 99%    Last Pain:  Vitals:   07/12/18 0434  TempSrc: Oral  PainSc:                  Blima Singer

## 2018-07-13 LAB — CBC WITH DIFFERENTIAL/PLATELET
Abs Immature Granulocytes: 0.2 10*3/uL — ABNORMAL HIGH (ref 0.00–0.07)
Basophils Absolute: 0.1 10*3/uL (ref 0.0–0.1)
Basophils Relative: 0 %
Eosinophils Absolute: 0.2 10*3/uL (ref 0.0–0.5)
Eosinophils Relative: 1 %
HCT: 21 % — ABNORMAL LOW (ref 36.0–46.0)
Hemoglobin: 7.4 g/dL — ABNORMAL LOW (ref 12.0–15.0)
Immature Granulocytes: 1 %
Lymphocytes Relative: 16 %
Lymphs Abs: 3.2 10*3/uL (ref 0.7–4.0)
MCH: 30.1 pg (ref 26.0–34.0)
MCHC: 35.2 g/dL (ref 30.0–36.0)
MCV: 85.4 fL (ref 80.0–100.0)
Monocytes Absolute: 1.5 10*3/uL — ABNORMAL HIGH (ref 0.1–1.0)
Monocytes Relative: 7 %
Neutro Abs: 15.4 10*3/uL — ABNORMAL HIGH (ref 1.7–7.7)
Neutrophils Relative %: 75 %
Platelets: 223 10*3/uL (ref 150–400)
RBC: 2.46 MIL/uL — ABNORMAL LOW (ref 3.87–5.11)
RDW: 14.1 % (ref 11.5–15.5)
WBC: 20.6 10*3/uL — ABNORMAL HIGH (ref 4.0–10.5)
nRBC: 0 % (ref 0.0–0.2)

## 2018-07-13 MED ORDER — CONCEPT DHA 53.5-38-1 MG PO CAPS: 1.0000 | ORAL_CAPSULE | Freq: Every day | ORAL | 4 refills | Status: AC

## 2018-07-13 NOTE — Care Management (Signed)
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  Patient discharged prior to completion of assessment.   RNCM consult mother Abcde by phone to complete assessment  Patient discharged to the care of mother.  Mother states that 35 year old daughter, and 62 year old son also live in the home.  Mother states her grandmother, sister, and brother live locally for support.  Moms PCP is Clayborne Dana.  Patient's Pediatrician will be [facility].  Mom states that she has all necessities for patient including, pack and play, diapers, formula, and car seat.  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  States "I probably used it at least once a week for nausea while I was waiting on my prescription for Zofran" RNCM encouraged mom to discontinue use.   Mom notified that DSS report would be made to [facility].   Report was made

## 2018-07-13 NOTE — Progress Notes (Signed)
Pt discharged with infant. Discharge instructions, prescriptions, and follow up appointments given to and reviewed with patient. Pt verbalized understanding. Escorted out by auxillary.  
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
